# Patient Record
Sex: Male | Born: 1990 | Race: White | Hispanic: No | Marital: Single | Smoking: Never smoker
Health system: Southern US, Community
[De-identification: ages and names within clinical notes are randomized; demographics above are authoritative.]

## PROBLEM LIST (undated history)

## (undated) DIAGNOSIS — S83519A Sprain of anterior cruciate ligament of unspecified knee, initial encounter: Secondary | ICD-10-CM

## (undated) HISTORY — PX: WISDOM TOOTH EXTRACTION: SHX21

---

## 2015-08-06 ENCOUNTER — Ambulatory Visit (INDEPENDENT_AMBULATORY_CARE_PROVIDER_SITE_OTHER): Payer: Managed Care, Other (non HMO) | Admitting: Medical

## 2015-08-06 ENCOUNTER — Encounter: Payer: Self-pay | Admitting: Medical

## 2015-08-06 VITALS — BP 110/72 | HR 80 | Ht 68.75 in | Wt 198.0 lb

## 2015-08-06 DIAGNOSIS — S8392XA Sprain of unspecified site of left knee, initial encounter: Secondary | ICD-10-CM | POA: Diagnosis not present

## 2015-08-06 NOTE — Progress Notes (Signed)
Subjective: Chief Complaint  Patient presents with  . Knee Pain    started wednesday. pt was playing basketball and "twisted" his lt knee. never had problems with it before not swollen    Here as a new patient.  Was [facility] prior, but hasn't been to a doctor in years.   Father was in [employer], Orthoptisttanker pilot.   Was playing basketball Wednesday night and hurt left knee.   Was running sideways, thinks he twisted the left knee funny.  Denies hearing a pop.   Had some pain with the twisting.   Stopped playing immediately.   Tried to stretch it out a bit.    Denies swelling.   Took some pain medication, iced for 15 minutes on and off for an hour for the past 2 days.  Today doesn't hurt as bad.    Was able to ambulate yesterday but it hurt.   Denies full ROM.   No prior knee injury or surgery.   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  No other aggravating or relieving factors. No other complaint.  ROS as in subjective   Objective: BP 110/72 mmHg  Pulse 80  Ht 5' 8.75" (1.746 m)  Wt 198 lb (89.812 kg)  BMI 29.46 kg/m2  Gen: wd, wn, nad Skin: unremarkable, no erythema or bruising MSK: mild tenderness left biceps femoris tendon medially and patellar tenderness, mild pain with knee ROM, worse with resisted knee flexion, but otherwise no swelling, nontender, no laxity, no other deformity.  Rest of bilat leg exam unremarkable No edema Legs neurovascularly intact    Assessment: Encounter Diagnosis  Name Primary?  . Left knee sprain, initial encounter Yes     Plan: reassured no obvious meniscal or ACL issue, seems to be mild sprain.   Advised over the weekend he c/t ice 20 min at a time, elevation, rest, stay off the leg when possible the next several days, he will c/t OTC Ibuprofen, and I would expect pain to resolve within 1-2 weeks.  Advised gradual return to activity over the next 2 weeks as pain resolves.  If worse or not significant improvement within a week, then  recheck.

## 2015-08-24 ENCOUNTER — Encounter: Payer: Self-pay | Admitting: Medical

## 2015-08-24 ENCOUNTER — Telehealth: Payer: Self-pay

## 2015-08-24 NOTE — Telephone Encounter (Signed)
Records and Immunization Report [facility]d from [facility] on pt. Placed in your folder.

## 2015-12-17 ENCOUNTER — Encounter: Payer: Self-pay | Admitting: Medical

## 2015-12-17 ENCOUNTER — Ambulatory Visit (INDEPENDENT_AMBULATORY_CARE_PROVIDER_SITE_OTHER): Payer: Managed Care, Other (non HMO) | Admitting: Medical

## 2015-12-17 VITALS — BP 108/70 | HR 72 | Ht 68.75 in | Wt 196.0 lb

## 2015-12-17 DIAGNOSIS — M25562 Pain in left knee: Secondary | ICD-10-CM

## 2015-12-17 DIAGNOSIS — S8992XD Unspecified injury of left lower leg, subsequent encounter: Secondary | ICD-10-CM

## 2015-12-17 DIAGNOSIS — S8992XA Unspecified injury of left lower leg, initial encounter: Secondary | ICD-10-CM | POA: Insufficient documentation

## 2015-12-17 NOTE — Progress Notes (Signed)
Subjective: Chief Complaint  Patient presents with  . Follow-up    Left knee still hurts with certain movement, stairs, biking.   Here for recheck on left knee.   I saw him back in May for injury playing basketball.  He initially had popping sensations, pain with twisting knee, pain going down stairs.  Since last visit he did RICE initially, got improvement of the popping and medial posterior pain, but he has continued to have pain in anterior medial knee joint line, pain in patella, pain particular going down stairs and bending the knee.    Denies swelling, no giving way, no paresthesias.    He has been careful with exercise due to pain. elliptical doesn't cause pain, but sometimes walking causes pain, definitely gets pain with squatting, sometimes with twisting, and going down stairs causes pain.  Currently not taking medication or ice or bracing for the symptoms.   Back in May his initial history was that while playing basketball he hurt left knee.  Was running sideways, thinks he twisted the left knee funny.  Denies hearing a pop.   Had some pain with the twisting.   Stopped playing immediately.   Tried to stretch it out a bit.    Denies swelling.   Took some pain medication, iced for 15 minutes on and off for an hour for the past 2 days.   Denies full ROM.   No prior knee injury or surgery.   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  No other aggravating or relieving factors. No other complaint.  ROS as in subjective   Objective: BP 108/70 (BP Location: Left Arm, Patient Position: Sitting, Cuff Size: Normal)   Pulse 72   Ht 5' 8.75" (1.746 m)   Wt 196 lb (88.9 kg)   SpO2 98%   BMI 29.16 kg/m   Gen: wd, wn, nad Skin: unremarkable, no erythema or bruising MSK: not tender to palpation.   Pain noted with knee flexion past 80 degrees, pain reported in superior portion of patella with flexion, pain noted in patellar tendon with flexion but not with palpation.   Although he originally reported  pain in medial joint line he is nontender to palpation nor does he seem to have pain with motion with medial joint line.  No joint swelling, no joint laxity. . No pop or grind with meniscal test, but he does have some pain with meniscus test.    Rest of bilat leg exam unremarkable No edema Legs neurovascularly intact    Assessment: Encounter Diagnoses  Name Primary?  . Left knee pain Yes  . Left knee injury, subsequent encounter      Plan: Discussed possible causes of his symptoms.   With repeated exam, he seems to note pain in different spots at different times on exam.   Not completley clear etiology.   I suspect patellofemoral issue based on exam, but can't rule out meniscal injury.  He hasn't had swelling even from original injury in May.   Will refer to ortho for further eval.    Jomarie LongsJoseph was seen today for follow-up.  Diagnoses and all orders for this visit:  Left knee pain -     Ambulatory referral to Orthopedic Surgery  Left knee injury, subsequent encounter -     Ambulatory referral to Orthopedic Surgery

## 2015-12-20 ENCOUNTER — Telehealth: Payer: Self-pay

## 2015-12-20 NOTE — Telephone Encounter (Signed)
Spoke to patient. [facility]

## 2015-12-21 ENCOUNTER — Other Ambulatory Visit: Payer: Self-pay | Admitting: Sports Medicine

## 2015-12-21 DIAGNOSIS — M25562 Pain in left knee: Secondary | ICD-10-CM

## 2015-12-24 ENCOUNTER — Ambulatory Visit
Admission: RE | Admit: 2015-12-24 | Discharge: 2015-12-24 | Disposition: A | Discharge status: Home / Self Care | Payer: Managed Care, Other (non HMO) | Source: Ambulatory Visit | Attending: Sports Medicine | Admitting: Sports Medicine

## 2015-12-24 DIAGNOSIS — M25562 Pain in left knee: Secondary | ICD-10-CM

## 2015-12-28 ENCOUNTER — Ambulatory Visit (INDEPENDENT_AMBULATORY_CARE_PROVIDER_SITE_OTHER): Payer: Managed Care, Other (non HMO) | Admitting: Sports Medicine

## 2015-12-28 DIAGNOSIS — S83512D Sprain of anterior cruciate ligament of left knee, subsequent encounter: Secondary | ICD-10-CM | POA: Diagnosis not present

## 2015-12-28 DIAGNOSIS — M25562 Pain in left knee: Secondary | ICD-10-CM | POA: Diagnosis not present

## 2015-12-30 ENCOUNTER — Other Ambulatory Visit: Payer: Self-pay

## 2016-01-06 ENCOUNTER — Encounter: Payer: Self-pay | Admitting: Physical Therapy

## 2016-01-06 ENCOUNTER — Ambulatory Visit: Payer: Managed Care, Other (non HMO) | Attending: Sports Medicine | Admitting: Physical Therapy

## 2016-01-06 DIAGNOSIS — M25562 Pain in left knee: Secondary | ICD-10-CM | POA: Diagnosis present

## 2016-01-06 DIAGNOSIS — R262 Difficulty in walking, not elsewhere classified: Secondary | ICD-10-CM | POA: Diagnosis present

## 2016-01-06 DIAGNOSIS — M6281 Muscle weakness (generalized): Secondary | ICD-10-CM | POA: Diagnosis present

## 2016-01-06 NOTE — Addendum Note (Signed)
Addended by: Narda AmberMARTIN, JENNIFER R on: 01/06/2016 01:52 PM   Modules accepted: Orders

## 2016-01-06 NOTE — Patient Instructions (Signed)
          Perform all exercises with pain free motion. (if it hurts stop).  20 reps each exercise 2-3 sets 2 times daily  RIE (rest, ice, elevate) if inflammation/swelling or pain

## 2016-01-06 NOTE — Therapy (Signed)
[facility] [address], [address] [place], [place], [address] Phone: [phone]   Fax:  [phone]  Physical Therapy Evaluation  Patient Details  Name: Tyler QuakerJoseph Delorey MRN: [id number] Date of Birth: 02/09/1991 Referring Provider: Gaspar BiddingMichael Rigby, MD  Encounter Date: 01/06/2016      PT End of Session - 01/06/16 1248    Visit Number 1   Number of Visits 9   Authorization Type [employer]   PT Start Time 1235   PT Stop Time 1315   PT Time Calculation (min) 40 min   Activity Tolerance Patient tolerated treatment well   Behavior During Therapy [facility] for tasks assessed/performed      History reviewed. No pertinent past medical history.  History reviewed. No pertinent surgical history.  There were no vitals filed for this visit.       Subjective Assessment - 01/06/16 1238    Subjective Pt reporting after playing basketball on 08/04/15 pt suffered Left knee twisting injury. Pt reports he was referred to Gaspar BiddingMichael Rigby, MD. MRI revealing ACL tear.    How long can you sit comfortably? unlimited   How long can you stand comfortably? unlimited   How long can you walk comfortably? depends on surface, difficulty down hill    Diagnostic tests MRI [facility]    Patient Stated Goals Knee stability   Currently in Pain? Yes   Pain Score 0-No pain   Pain Location Knee   Pain Orientation Left   Pain Descriptors / Indicators Aching   Pain Type Chronic pain   Pain Onset More than a month ago   Pain Frequency Intermittent   Aggravating Factors  down hill walking, stairs, bending the knee, running, side motions, pivots, playing sports   Pain Relieving Factors resting,    Effect of Pain on Daily Activities unable to play sports, walking down stairs,    Multiple Pain Sites No            [facility] PT Assessment - 01/06/16 0001      Assessment   Medical Diagnosis left ACL sprain   Referring Provider Gaspar BiddingMichael Rigby, MD   Onset  Date/Surgical Date 08/04/15   Hand Dominance Right   Prior Therapy none     Precautions   Precautions None     Restrictions   Weight Bearing Restrictions No     Balance Screen   Has the patient fallen in the past 6 months No     Home Environment   Living Environment Private residence   Available Help at Discharge Family;Friend(s)     Prior Function   Level of Independence Independent   Vocation Full time employment   [employer] Works at [employer] for [employer]   Leisure play basketball, soccer, run, workout     Cognition   Overall Cognitive Status Within Functional Limits for tasks assessed     ROM / Strength   AROM / PROM / Strength AROM;Strength     AROM   AROM Assessment Site Knee   Right/Left Knee Left   Left Knee Extension 0   Left Knee Flexion 128     Strength   Strength Assessment Site Knee   Right/Left Knee Right;Left   Right Knee Flexion 5/5   Right Knee Extension 5/5   Left Knee Flexion 4/5   Left Knee Extension 4/5     Transfers   Five time sit to stand comments  15 seconds     Ambulation/Gait   Ambulation/Gait Yes  Ambulation/Gait Assistance 7: Independent   Ambulation Distance (Feet) 50 Feet   Assistive device None   Gait Pattern Step-through pattern  wide BOS   Ambulation Surface Level;Indoor                           PT Education - 01/06/16 1321    Education provided Yes   Education Details HEP   Person(s) Educated Patient   Methods Explanation;Demonstration;Handout   Comprehension Verbalized understanding;Returned demonstration          PT Short Term Goals - 01/06/16 1331      PT SHORT TERM GOAL #1   Title pt will be indpendent in his HEP and progression   Baseline issued 01/06/16   Time 2   Period Weeks   Status New           PT Long Term Goals - 01/06/16 1332      PT LONG TERM GOAL #1   Title Pt will be independent with a HEP and safe gym exercises and use of equipment.    Time 6   Period Weeks   Status New     PT LONG TERM GOAL #2   Title Pt will be able to descend the stairs with no pain using no hand rails with step over step pattern.   Time 6   Period Weeks   Status New     PT LONG TERM GOAL #3   Title Pt will improve his FOTO from 36% limiation to </= 26% limitation.    Baseline 36% limited   Time 6   Period Weeks   Status New     PT LONG TERM GOAL #4   Title Pt will be able to begin jogging on level surfaces to prepare for back to sports.    Baseline pt unable to currently due to increased pain   Time 6   Period Weeks   Status New               Plan - 01/06/16 1322    Clinical Impression Statement Pt presents with sprain in left ACL. Pt is very active and was injured while playing basketball. Pt has continued to work at the gym with pain free exercises.    Rehab Potential Excellent   PT Frequency 1x / week   PT Duration 6 weeks   PT Treatment/Interventions Functional mobility training;Stair training;Therapeutic activities;Therapeutic exercise;Balance training;Patient/family education;Passive range of motion;Manual techniques;Taping;Cryotherapy;Vasopneumatic Device   PT Next Visit Plan instruct pt on proper use of gym equiptment to prevent further injury to ACL, bike, side stepping squats and lunges, resistive exercises as pt tolerates   PT Home Exercise Plan SLR, Hip ABD in sidelying, hip ADD in sidelying, prone hip extension, wall squats   Consulted and Agree with Plan of Care Patient      Patient will benefit from skilled therapeutic intervention in order to improve the following deficits and impairments:  Difficulty walking, Pain, Decreased activity tolerance, Impaired perceived functional ability, Decreased strength  Visit Diagnosis: Acute pain of left knee  Difficulty in walking, not elsewhere classified  Muscle weakness (generalized)     Problem List Patient Active Problem List   Diagnosis Date Noted  . Left knee  pain 12/17/2015  . Left knee injury 12/17/2015    Sharmon Leyden, MPT  01/06/2016, 1:47 PM  [facility] [address], [address] [place], [place], [address] Phone: [phone]  Fax:  [phone]  Name: Trafton Roker MRN: [id number] Date of Birth: 03-06-1991

## 2016-01-11 ENCOUNTER — Ambulatory Visit: Payer: Managed Care, Other (non HMO)

## 2016-01-11 DIAGNOSIS — M6281 Muscle weakness (generalized): Secondary | ICD-10-CM

## 2016-01-11 DIAGNOSIS — R262 Difficulty in walking, not elsewhere classified: Secondary | ICD-10-CM

## 2016-01-11 DIAGNOSIS — M25562 Pain in left knee: Secondary | ICD-10-CM | POA: Diagnosis not present

## 2016-01-11 NOTE — Patient Instructions (Addendum)
Forward Lunge    Standing with feet shoulder width apart and stomach tight, step forward with left leg. Repeat __2x10__ times per set. Do __1-2__ sets per session. Do 1-2____ sessions per day.   Anterior Step-Down    Stand with both feet on _6__ inch step. Step down in A direction with left foot, touching heel to the floor and return _2x10__ times. _2__ sets _1-2__ times per day.   HIP: Hamstrings - Short Sitting    Rest leg on raised surface. Keep knee straight. Lift chest. Hold __20_ seconds. __3_ reps per set, _3__ sets per day, ___ days per week  Copyright  VHI. All rights reserved.    [facility] [address], [address] [place], [place] [address] Phone # [phone] Fax [phone]

## 2016-01-11 NOTE — Therapy (Signed)
[facility] [address] [place], [place], [address] Phone: [phone]   Fax:  [phone]  Physical Therapy Treatment  Patient Details  Name: Tyler Lozano MRN: [id number] Date of Birth: [birth year] Referring Provider: Gaspar Bidding, MD  Encounter Date: 01/11/2016      PT End of Session - 01/11/16 0757    Visit Number 2   Authorization Type [employer]   PT Start Time 0727   PT Stop Time 0758   PT Time Calculation (min) 31 min   Activity Tolerance Patient tolerated treatment well   Behavior During Therapy [facility] for tasks assessed/performed      History reviewed. No pertinent past medical history.  History reviewed. No pertinent surgical history.  There were no vitals filed for this visit.      Subjective Assessment - 01/11/16 0726    Subjective Pt reports that he has been doing the exercises.  Not able to run.     Currently in Pain? No/denies                         [facility] Adult PT Treatment/Exercise - 01/11/16 0001      Exercises   Exercises Knee/Hip     Knee/Hip Exercises: Stretches   Active Hamstring Stretch Left;3 reps;20 seconds     Knee/Hip Exercises: Machines for Strengthening   Cybex Leg Press Bil legs: 90# x 10, 110# 2x10, Lt only 50# 2x10     Knee/Hip Exercises: Standing   Forward Lunges Left;2 sets;10 reps   Step Down 2 sets;10 reps;Left;Hand Hold: 1;Step Height: 6"   SLS on mini tramp 3x 20 seconds  verbal cues for quad activation   Walking with Sports Cord 35# forward and revers x10, sidestepping x 5 each                PT Education - 01/11/16 0752    Education provided Yes   Education Details hamstring stretch, step down, lunge   Person(s) Educated Patient   Methods Explanation;Demonstration;Handout   Comprehension Verbalized understanding;Returned demonstration          PT Short Term Goals - 01/11/16 0727      PT SHORT TERM GOAL #1   Title pt will be  indpendent in his HEP and progression   Time 2   Period Weeks   Status On-going           PT Long Term Goals - 01/11/16 6962      PT LONG TERM GOAL #1   Title Pt will be independent with a HEP and safe gym exercises and use of equipment.   Time 6   Period Weeks   Status On-going               Plan - 01/11/16 0727    Clinical Impression Statement Pt with only 1 session after evaluation.  Pt is independent in HEP for Lt knee strength.  Pt was able to complete all exercises in the clinic for strength without limitation.  Pt will try to perform leg press at the gym with bil. legs and try to use Lt LE more with this.  Pt will continue to benefit from skilled PT for Lt LE strength.    Rehab Potential Excellent   PT Frequency 1x / week   PT Duration 6 weeks   PT Treatment/Interventions Functional mobility training;Stair training;Therapeutic activities;Therapeutic exercise;Balance training;Patient/family education;Passive range of motion;Manual techniques;Taping;Cryotherapy;Vasopneumatic Device   PT Next Visit Plan  instruct pt on proper use of gym equiptment to prevent further injury to ACL, bike, side stepping squats and lunges, resistive exercises as pt tolerates   Consulted and Agree with Plan of Care Patient      Patient will benefit from skilled therapeutic intervention in order to improve the following deficits and impairments:  Difficulty walking, Pain, Decreased activity tolerance, Impaired perceived functional ability, Decreased strength  Visit Diagnosis: Acute pain of left knee  Difficulty in walking, not elsewhere classified  Muscle weakness (generalized)     Problem List Patient Active Problem List   Diagnosis Date Noted  . Left knee pain 12/17/2015  . Left knee injury 12/17/2015    Lorrene ReidKelly Sheri Prows, PT 01/11/16 7:59 AM  [facility] [address], [address] [place], [place], [address] Phone: [phone]    Fax:  [phone]  Name: [place] Lozano MRN: [id number] Date of Birth: 07/31/1990

## 2016-01-18 ENCOUNTER — Ambulatory Visit: Payer: Managed Care, Other (non HMO)

## 2016-01-18 DIAGNOSIS — M6281 Muscle weakness (generalized): Secondary | ICD-10-CM

## 2016-01-18 DIAGNOSIS — R262 Difficulty in walking, not elsewhere classified: Secondary | ICD-10-CM

## 2016-01-18 DIAGNOSIS — M25562 Pain in left knee: Secondary | ICD-10-CM | POA: Diagnosis not present

## 2016-01-18 NOTE — Therapy (Addendum)
[facility] [address], [place], [place], [address] Phone: [phone]   Fax:  [phone]  Physical Therapy Treatment  Patient Details  Name: Tyler Lozano MRN: [id number] Date of Birth: [birth year] Referring Provider: Teresa Coombs, MD  Encounter Date: 01/18/2016      PT End of Session - 01/18/16 0800    Visit Number 3   PT Start Time 0729   PT Stop Time 0800   PT Time Calculation (min) 31 min   Activity Tolerance Patient tolerated treatment well   Behavior During Therapy [facility] for tasks assessed/performed      History reviewed. No pertinent past medical history.  History reviewed. No pertinent surgical history.  There were no vitals filed for this visit.      Subjective Assessment - 01/18/16 0731    Subjective Lt knee doing well.  A few episodes of Lt knee giving out on a wet surface.     Currently in Pain? No/denies                         [facility] Adult PT Treatment/Exercise - 01/18/16 0001      Knee/Hip Exercises: Stretches   Active Hamstring Stretch Left;3 reps;20 seconds     Knee/Hip Exercises: Machines for Strengthening   Cybex Leg Press 110# 1x10, 120# 2x10Lt only 60# 2x10     Knee/Hip Exercises: Standing   Step Down 2 sets;10 reps;Left;Hand Hold: 1;Step Height: 6"   SLS on mini tramp 3x 20 seconds  verbal cues for quad activation   Rebounder single leg stance on Lt on level surface: 3x10 tosses with red ball   Walking with Sports Cord 35# forward and revers x10, sidestepping x 5 each                  PT Short Term Goals - 01/18/16 0729      PT SHORT TERM GOAL #1   Title pt will be indpendent in his HEP and progression   Status Achieved           PT Long Term Goals - 01/18/16 0729      PT LONG TERM GOAL #1   Title Pt will be independent with a HEP and safe gym exercises and use of equipment.   Time 6   Period Weeks   Status On-going     PT LONG TERM  GOAL #2   Title Pt will be able to descend the stairs with no pain using no hand rails with step over step pattern.   Time 6   Period Weeks   Status On-going     PT LONG TERM GOAL #3   Title Pt will improve his FOTO from 36% limiation to </= 26% limitation.    Period Weeks   Status On-going     PT LONG TERM GOAL #4   Title Pt will be able to begin jogging on level surfaces to prepare for back to sports.    Time 6   Period Weeks   Status On-going               Plan - 01/18/16 0731    Clinical Impression Statement Pt continues to work to strengthen the Lt knee at the gym.  Pt reports that he is taking it easy with descending steps due to feeling unstable in the Lt knee. Pt demonstrates instability of the Lt knee with step-down exercise today.   Pt has not  been running.  Able to perform all exericse in clinic without difficulty.  Pt will put PT on hold until he sees MD.     Rehab Potential Excellent   PT Frequency 1x / week   PT Duration 6 weeks   PT Treatment/Interventions Functional mobility training;Stair training;Therapeutic activities;Therapeutic exercise;Balance training;Patient/family education;Passive range of motion;Manual techniques;Taping;Cryotherapy;Vasopneumatic Device   PT Next Visit Plan instruct pt on proper use of gym equiptment to prevent further injury to ACL, bike, side stepping squats and lunges, resistive exercises as pt tolerates.  Pt will see MD 01/25/16 and discuss future PT.     Consulted and Agree with Plan of Care Patient      Patient will benefit from skilled therapeutic intervention in order to improve the following deficits and impairments:  Difficulty walking, Pain, Decreased activity tolerance, Impaired perceived functional ability, Decreased strength  Visit Diagnosis: Acute pain of left knee  Difficulty in walking, not elsewhere classified  Muscle weakness (generalized)     Problem List Patient Active Problem List   Diagnosis Date  Noted  . Left knee pain 12/17/2015  . Left knee injury 12/17/2015     Sigurd Sos, PT 01/18/16 8:02 AM PHYSICAL THERAPY DISCHARGE SUMMARY  Visits from Start of Care: 3  Current functional level related to goals / functional outcomes: Pt didn't return after session on 01/18/16.  He was going to see MD to determine future PT and didn't return.     Remaining deficits: See above for most current status.     Education / Equipment: HEP,gym exercises Plan: Patient agrees to discharge.  Patient goals were partially met. Patient is being discharged due to not returning since the last visit.  ?????        Sigurd Sos, PT 02/24/16 8:50 AM  [facility] [address], [place], [place], [address] Phone: [phone]   Fax:  [phone]  Name: Ardon Franklin MRN: [id number] Date of Birth: 07-19-1990

## 2016-01-25 ENCOUNTER — Ambulatory Visit (INDEPENDENT_AMBULATORY_CARE_PROVIDER_SITE_OTHER): Payer: Managed Care, Other (non HMO) | Admitting: Sports Medicine

## 2016-01-25 ENCOUNTER — Encounter (INDEPENDENT_AMBULATORY_CARE_PROVIDER_SITE_OTHER): Payer: Self-pay | Admitting: Sports Medicine

## 2016-01-25 VITALS — BP 117/67 | HR 72 | Ht 68.75 in | Wt 195.0 lb

## 2016-01-25 DIAGNOSIS — M25562 Pain in left knee: Secondary | ICD-10-CM

## 2016-01-25 DIAGNOSIS — S83512D Sprain of anterior cruciate ligament of left knee, subsequent encounter: Secondary | ICD-10-CM

## 2016-01-25 DIAGNOSIS — S83512A Sprain of anterior cruciate ligament of left knee, initial encounter: Secondary | ICD-10-CM | POA: Insufficient documentation

## 2016-01-25 NOTE — Progress Notes (Signed)
Tyler QuakerJoseph Fertig - 25 y.o. male MRN [id number]  Date of birth: 01/11/1991  Office Visit Note: Visit Date: 01/25/2016 PCP: Ernst BreachYSINGER, DAVID SHANE, PA-C Referred by: Jac Canavanysinger, David S, PA-C  Subjective: Chief Complaint  Patient presents with  . Left Knee - Follow-up  . ROV   HPI: Patient states left knee is doing better.  Currently waiting to decide about [employer] brace, would like to discuss more.    He has been working with physical therapy as well as working diligently in [employer] on his own. Symptoms are significantly improved & symptoms of giving way have greatly diminished although still having some difficulty going down steps. He has had [employer] reach out to him however the cost is prohibitive. He does have an upcoming ski trip however is unsure as to whether or not he will actually ski. Otherwise his goals are to returned activities including basketball.    ROS Otherwise per HPI.  Assessment & Plan: Visit Diagnoses:  1. Acute pain of left knee   2. Sprain of anterior cruciate ligament of left knee, subsequent encounter     Plan: Findings:  The symptoms are significantly better than when he initially presented. I do think continuing with physical therapy & been diligent with home exercise program will likely provide him with the symptom control he desires. I would like to evaluate him further at follow-up with dynamic 1 leg agility testing prior to agreeing to ski trip. Okay to hold off on obtaining the brace at this time & we discussed that a hinged knee brace would likely not provide him any significant adequate protection compared to appropriate rehabilitation. We'll plan to see him back in 6 weeks for once again dynamic testing of his knee.    Meds & Orders: No orders of the defined types were placed in this encounter.  No orders of the defined types were placed in this encounter.   Follow-up: Return in about 6 weeks (around 03/07/2016) for repeat clinical exam.   Procedures: No  procedures performed  No notes on file   Clinical History: No specialty comments available.  He reports that he has never smoked. He does not have any smokeless tobacco history on file. No results for input(s): HGBA1C, LABURIC in the last 8760 hours.  Objective:  VS:  HT:5' 8.75" (174.6 cm)   WT:195 lb (88.5 kg)  BMI:29.1    BP:117/67  HR:72bpm  TEMP: ( )  RESP:  Physical Exam  Constitutional: No distress.  HENT:  Head: Normocephalic and atraumatic.  Eyes: Right eye exhibits no discharge. Left eye exhibits no discharge. No scleral icterus.  Pulmonary/Chest: Effort normal. No respiratory distress.  Neurological: He is alert.  Appropriately interactive.  Skin: Skin is warm and dry. No rash noted. He is not diaphoretic. No erythema. No pallor.  Psychiatric: Judgment normal.    Left Knee Exam   Comments:  Knee is overall well line in quite muscular. He has 4-5 mm anterior translation with anterior drawer testing but it otherwise constrained knee. His knee is stable to varus & valgus strain as well as pain-free with McMurray's & Thessaly testing. VMO definition is markedly improved.     Imaging: No results found.  Past Medical/Family/Surgical/Social History: Medications & Allergies reviewed per EMR Patient Active Problem List   Diagnosis Date Noted  . Sprain of anterior cruciate ligament of left knee 01/25/2016  . Left knee pain 12/17/2015  . Left knee injury 12/17/2015   No past medical history on file. No  family history on file. No past surgical history on file. Social History   Occupational History  . Not on file.   Social History Main Topics  . Smoking status: Never Smoker  . Smokeless tobacco: Not on file  . Alcohol use Not on file  . Drug use: Unknown  . Sexual activity: Not on file

## 2016-03-07 ENCOUNTER — Encounter (INDEPENDENT_AMBULATORY_CARE_PROVIDER_SITE_OTHER): Payer: Self-pay | Admitting: Sports Medicine

## 2016-03-07 ENCOUNTER — Ambulatory Visit (INDEPENDENT_AMBULATORY_CARE_PROVIDER_SITE_OTHER): Payer: Managed Care, Other (non HMO) | Admitting: Sports Medicine

## 2016-03-07 VITALS — BP 113/60 | HR 86 | Ht 68.75 in | Wt 195.0 lb

## 2016-03-07 DIAGNOSIS — S83512D Sprain of anterior cruciate ligament of left knee, subsequent encounter: Secondary | ICD-10-CM | POA: Diagnosis not present

## 2016-03-07 DIAGNOSIS — M25562 Pain in left knee: Secondary | ICD-10-CM | POA: Diagnosis not present

## 2016-03-07 NOTE — Progress Notes (Signed)
   Tyler Lozano - 25 y.o. male MRN [id number]  Date of birth: 07/08/1990  Office Visit Note: Visit Date: 03/07/2016 PCP: Ernst BreachYSINGER, DAVID SHANE, PA-C Referred by: Jac Canavanysinger, David S, PA-C  Subjective: Chief Complaint  Patient presents with  . Left Knee - Follow-up  . Follow-up    Patient states left knee doing fine. Would like to talk about surgery?   HPI: Patient reports continuing to have improvement however feeling as though something is off especially with side-to-side motions. He was able to run 4 miles yesterday & did fine from a pain standpoint but had discomfort especially while stretching & with the above motion. He is not requiring any medication. No swelling appreciated. He would like to discuss anterior cruciate ligament reconstruction given the continued symptoms. ROS: Otherwise per HPI.   Clinical History: No specialty comments available.  He reports that he has never smoked. He does not have any smokeless tobacco history on file.  No results for input(s): HGBA1C, LABURIC in the last 8760 hours.  Assessment & Plan: Visit Diagnoses:    ICD-9-CM ICD-10-CM   1. Acute pain of left knee 719.46 M25.562   2. Sprain of anterior cruciate ligament of left knee, subsequent encounter V58.89 S83.512D    844.2      Plan: Given the persistent symptoms of instability & feeling as though something were "not right" anterior cruciate ligament reconstruction is likely indicated given the prior findings on MRI. Proximal tear of the anterior cruciate ligament with a constrained knee is the most likely cause of his symptoms & the patient understands the recovery aspects from anterior cruciate ligament reconstruction in light discuss this option with Dr. August Saucerean. He should continue with therapeutic exercises & rehabbing the knee as this will only speed up his recovery after anterior cruciate ligament reconstruction.   Follow-up: Return for with Dr. August Saucerean ASAP for Surgical consult.  Meds: No orders of  the defined types were placed in this encounter.  Procedures: No notes on file   Objective:  VS:  HT:5' 8.75" (174.6 cm)   WT:195 lb (88.5 kg)  BMI:29.1    BP:113/60  HR:86bpm  TEMP: ( )  RESP:  Physical Exam:  Adult male. Alert and appropriate.  In no acute distress.  Lower extremities are overall well aligned with no significant deformity. No significant swelling.  Distal pulses 2+/4. No significant bruising/ecchymosis or erythema the skin Left knee: Well aligned. VMO definition is less on the left than on the right. He has a ligamentously constrained knee however has increased laxity with anterior drawer & Lachman's on the left compared to the right. There is a valgus strain are normal. Some discomfort with McMurray's & reproducible symptoms of instability & uneasiness with McMurray's but no appreciable click. Imaging: No results found.  Past Medical/Family/Surgical/Social History: Medications & Allergies reviewed per EMR Patient Active Problem List   Diagnosis Date Noted  . Sprain of anterior cruciate ligament of left knee 01/25/2016  . Left knee pain 12/17/2015  . Left knee injury 12/17/2015   No past medical history on file. No family history on file. No past surgical history on file. Social History   Occupational History  . Not on file.   Social History Main Topics  . Smoking status: Never Smoker  . Smokeless tobacco: Not on file  . Alcohol use Not on file  . Drug use: Unknown  . Sexual activity: Not on file

## 2016-03-07 NOTE — Patient Instructions (Signed)
I am transferring practices as of January 1st  to [facility] at [place].  This is a great opportunity & I am saddened to be leaving [facility] however & excited for new opportunities. I will continue to be seeing patients at [facility] through the end of December. I am happy to see you at the new location but also am confident that you are in great hands with the excellent providers here at [facility].  We are not currently scheduling patients at the new location at this time but if you look on [facility]'s website a contact information should be available there closer to January. Additionally [email or web address] will have information when it becomes available.    The telephone number will be [phone]  - Nobody will be answering this phone number until closer to January. 

## 2016-03-13 ENCOUNTER — Ambulatory Visit (INDEPENDENT_AMBULATORY_CARE_PROVIDER_SITE_OTHER): Payer: Managed Care, Other (non HMO) | Admitting: Orthopedic Surgery

## 2016-03-13 ENCOUNTER — Encounter (INDEPENDENT_AMBULATORY_CARE_PROVIDER_SITE_OTHER): Payer: Self-pay | Admitting: Orthopedic Surgery

## 2016-03-13 DIAGNOSIS — S83512D Sprain of anterior cruciate ligament of left knee, subsequent encounter: Secondary | ICD-10-CM

## 2016-03-13 NOTE — Progress Notes (Signed)
Office Visit Note   Patient: Tyler Lozano           Date of Birth: 05/21/1990           MRN: [id number] Visit Date: 03/13/2016 Requested by: Jac Canavanavid S Tysinger, PA-C [address] [place], [place] [address] PCP: Ernst BreachYSINGER, DAVID SHANE, PA-C  Subjective: Chief Complaint  Patient presents with  . Left Knee - Pain    HPIJoseph is a 25 year old patient who is active who injured his left knee playing basketball 3 or 4 months ago.  MRI scanning showed anterior cruciate ligament tear.  He has tried rehabilitation and strengthening but reports continued symptomatic instability.  He wants to return to playing basketball and soccer.  There is no family history of DVT or pulmonary embolism.  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  Patient denies  fevers or chills.    Assessment & Plan: Visit Diagnoses:  1. 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  Plan is anterior cruciate ligament reconstruction using hamstring autograft.  Risks and benefits discussed with the patient including not limited to infection or vessel damage knee stiffness.  Time out of work also discussed with the patient as well as rehabilitative goals.  All questions answered  Follow-Up Instructions: No Follow-up on file.   Orders:  No orders of the defined types were placed in this encounter.  No orders of the defined types were placed in this encounter.     Procedures: No procedures performed   Clinical Data: No additional  findings.  Objective: Vital Signs: There were no vitals taken for this visit.  Physical Exam   Constitutional: Patient appears well-developed HEENT:  Head: Normocephalic Eyes:EOM are normal Neck: Normal range of motion Cardiovascular: Normal rate Pulmonary/chest: Effort normal Neurologic: Patient is alert Skin: Skin is warm Psychiatric: Patient has normal mood and affect    Ortho ExamExamination of the left knee demonstrates full motion from extension to full flexion.  There is no posterior lateral rotatory instability.  Extensor mechanism is intact.  There is no effusion.  There is no groin pain with internal/external rotation on the left-hand side.  Pedal pulses palpable.  Collaterals are stable to varus and valgus stress at 0 and 30.  PCL is intact anterior cruciate ligament is out there is no other masses lymph adenopathy or skin changes noted in the left knee region  Specialty Comments:  No specialty comments available.  Imaging: No results found.   PMFS History: Patient Active Problem List   Diagnosis Date Noted  . Sprain of anterior cruciate ligament of left knee 01/25/2016  . Left knee pain 12/17/2015  . Left knee injury 12/17/2015   No past medical history on file.  No family history on file.  No past surgical history on file. Social History   Occupational History  . Not on file.   Social History Main Topics  . Smoking status: Never Smoker  . Smokeless tobacco: Not on  file  . Alcohol use Not on file  . Drug use: Unknown  . Sexual activity: Not on file

## 2016-03-31 ENCOUNTER — Other Ambulatory Visit (INDEPENDENT_AMBULATORY_CARE_PROVIDER_SITE_OTHER): Payer: Self-pay | Admitting: Orthopedic Surgery

## 2016-03-31 DIAGNOSIS — S83512A Sprain of anterior cruciate ligament of left knee, initial encounter: Secondary | ICD-10-CM

## 2016-04-19 ENCOUNTER — Inpatient Hospital Stay (INDEPENDENT_AMBULATORY_CARE_PROVIDER_SITE_OTHER): Payer: Managed Care, Other (non HMO) | Admitting: Orthopedic Surgery

## 2016-05-08 NOTE — Pre-Procedure Instructions (Signed)
Wyn QuakerJoseph Prater  05/08/2016      [facility] [place] - [address] [place] [address] Phone: [phone] Fax: [phone]    Your procedure is scheduled on FEBRUARY 20  [facility] at 0900 A.M.  Call this number if you have problems the morning of surgery:  [phone]   Remember:  Do not eat food or drink liquids after midnight.   Take these medicines the morning of surgery with A SIP OF WATER NONE  7 days prior to surgery STOP taking any Aspirin, Aleve, Naproxen, Ibuprofen, Motrin, Advil, Goody's, BC's, all herbal medications, fish oil, and all vitamins    Do not wear jewelry  Do not wear lotions, powders, orcologne, or deoderant.  Men may shave face and neck.  Do not bring valuables to the hospital.  [facility] is not responsible for any belongings or valuables.  Contacts, dentures or bridgework may not be worn into surgery.  Leave your suitcase in the car.  After surgery it may be brought to your room.  For patients admitted to the hospital, discharge time will be determined by your treatment team.  Patients discharged the day of surgery will not be allowed to drive home.    Special instructions:   [facility]- Preparing For Surgery  Before surgery, you can play an important role. Because skin is not sterile, your skin needs to be as free of germs as possible. You can reduce the number of germs on your skin by washing with CHG (chlorahexidine gluconate) Soap before surgery.  CHG is an antiseptic cleaner which kills germs and bonds with the skin to continue killing germs even after washing.  Please do not use if you have an allergy to CHG or antibacterial soaps. If your skin becomes reddened/irritated stop using the CHG.  Do not shave (including legs and underarms) for at least 48 hours prior to first CHG shower. It is OK to shave your face.  Please follow these instructions  carefully.   1. Shower the NIGHT BEFORE SURGERY and the MORNING OF SURGERY with CHG.   2. If you chose to wash your hair, wash your hair first as usual with your normal shampoo.  3. After you shampoo, rinse your hair and body thoroughly to remove the shampoo.  4. Use CHG as you would any other liquid soap. You can apply CHG directly to the skin and wash gently with a scrungie or a clean washcloth.   5. Apply the CHG Soap to your body ONLY FROM THE NECK DOWN.  Do not use on open wounds or open sores. Avoid contact with your eyes, ears, mouth and genitals (private parts). Wash genitals (private parts) with your normal soap.  6. Wash thoroughly, paying special attention to the area where your surgery will be performed.  7. Thoroughly rinse your body with warm water from the neck down.  8. DO NOT shower/wash with your normal soap after using and rinsing off the CHG Soap.  9. Pat yourself dry with a CLEAN TOWEL.   10. Wear CLEAN PAJAMAS   11. Place CLEAN SHEETS on your bed the night of your first shower and DO NOT SLEEP WITH PETS.    Day of Surgery: Do not apply any deodorants/lotions. Please wear clean clothes to the hospital/surgery center.      Please read over the following fact sheets that you were given.

## 2016-05-09 ENCOUNTER — Encounter (HOSPITAL_COMMUNITY)
Admission: RE | Admit: 2016-05-09 | Discharge: 2016-05-09 | Disposition: A | Discharge status: Home / Self Care | Payer: Managed Care, Other (non HMO) | Source: Ambulatory Visit | Attending: Orthopedic Surgery | Admitting: Orthopedic Surgery

## 2016-05-09 ENCOUNTER — Encounter (HOSPITAL_COMMUNITY): Payer: Self-pay

## 2016-05-09 DIAGNOSIS — Z01818 Encounter for other preprocedural examination: Secondary | ICD-10-CM | POA: Insufficient documentation

## 2016-05-09 HISTORY — DX: Sprain of anterior cruciate ligament of unspecified knee, initial encounter: S83.519A

## 2016-05-09 LAB — CBC
HEMATOCRIT: 44.4 % (ref 39.0–52.0)
HEMOGLOBIN: 15.4 g/dL (ref 13.0–17.0)
MCH: 30.5 pg (ref 26.0–34.0)
MCHC: 34.7 g/dL (ref 30.0–36.0)
MCV: 87.9 fL (ref 78.0–100.0)
Platelets: 273 10*3/uL (ref 150–400)
RBC: 5.05 MIL/uL (ref 4.22–5.81)
RDW: 12.4 % (ref 11.5–15.5)
WBC: 6.8 10*3/uL (ref 4.0–10.5)

## 2016-05-09 LAB — BASIC METABOLIC PANEL
Anion gap: 10 (ref 5–15)
BUN: 11 mg/dL (ref 6–20)
CHLORIDE: 105 mmol/L (ref 101–111)
CO2: 24 mmol/L (ref 22–32)
CREATININE: 0.87 mg/dL (ref 0.61–1.24)
Calcium: 10.1 mg/dL (ref 8.9–10.3)
GFR calc Af Amer: >60 mL/min (ref >60)
GFR calc non Af Amer: >60 mL/min (ref >60)
GLUCOSE: 95 mg/dL (ref 65–99)
POTASSIUM: 4.1 mmol/L (ref 3.5–5.1)
SODIUM: 139 mmol/L (ref 135–145)

## 2016-05-13 NOTE — H&P (Signed)
Tyler Lozano is an 26 y.o. male.   Chief Complaint: Left knee pain and instability HPI: Tyler Lozano is a 26 year old patient with left knee pain and instability.  He injured it last year playing basketball.  He's had symptomatic instability since that time despite rehabilitation.  He desires to compete in cutting and pivoting sports.  There is no family history of DVT or pulmonary embolism.  He has 6 stairs at home and will have his father come stay with him while he recovers.  Past Medical History:  Diagnosis Date  . Anterior cruciate ligament complete tear    left    Past Surgical History:  Procedure Laterality Date  . WISDOM TOOTH EXTRACTION      No family history on file. Social History:  reports that he has never smoked. He has never used smokeless tobacco. He reports that he drinks about 3.0 oz of alcohol per week . He reports that he does not use drugs.  Allergies: No Known Allergies  No prescriptions prior to admission.    No results found for this or any previous visit (from the past 48 hour(s)). No results found.  Review of Systems  Musculoskeletal: Positive for joint pain.  All other systems reviewed and are negative.   There were no vitals taken for this visit. Physical Exam  Constitutional: He appears well-developed.  HENT:  Head: Normocephalic.  Eyes: Pupils are equal, round, and reactive to light.  Cardiovascular: Normal rate.   Respiratory: Effort normal.  Neurological: He is alert.  Skin: Skin is warm.  Psychiatric: He has a normal mood and affect.  Left knee demonstrates good range of motion with no posterior lateral rotatory instability.  Extensor mechanism is intact.  Pedal pulses palpable.  Collaterals are stable at 0 and 30 to varus and valgus stress.  Anterior cruciate ligament is out PCL is intact   Assessment/Plan Impression is left knee anterior cruciate ligament tear.  Plan anterior cruciate ligament reconstruction using hamstring autograft.  Risks  and benefits discussed including but not limited to infection or vessel damage weakness as well as the prolonged recovery involved prior to returning to cutting and pivoting sports.  Time out of work also discussed.  All questions answered.  Burnard BuntingG Scott Zarai Orsborn, MD 05/13/2016, 2:03 PM

## 2016-05-15 MED ORDER — CEFAZOLIN SODIUM-DEXTROSE 2-4 GM/100ML-% IV SOLN
2.0000 g | INTRAVENOUS | Status: AC
  Administered 2016-05-16: 2 g via INTRAVENOUS
  Filled 2016-05-15: qty 100

## 2016-05-16 ENCOUNTER — Encounter (HOSPITAL_COMMUNITY): Payer: Self-pay | Admitting: Certified Registered"

## 2016-05-16 ENCOUNTER — Encounter (HOSPITAL_COMMUNITY)
Admission: RE | Disposition: A | Discharge status: Home / Self Care | Payer: Self-pay | Source: Ambulatory Visit | Attending: Orthopedic Surgery

## 2016-05-16 ENCOUNTER — Ambulatory Visit (HOSPITAL_COMMUNITY): Payer: Managed Care, Other (non HMO) | Admitting: Certified Registered"

## 2016-05-16 ENCOUNTER — Ambulatory Visit (HOSPITAL_COMMUNITY)
Admission: RE | Admit: 2016-05-16 | Discharge: 2016-05-16 | Disposition: A | Discharge status: Home / Self Care | Payer: Managed Care, Other (non HMO) | Source: Ambulatory Visit | Attending: Orthopedic Surgery | Admitting: Orthopedic Surgery

## 2016-05-16 DIAGNOSIS — Z7982 Long term (current) use of aspirin: Secondary | ICD-10-CM | POA: Diagnosis not present

## 2016-05-16 DIAGNOSIS — S83512A Sprain of anterior cruciate ligament of left knee, initial encounter: Secondary | ICD-10-CM | POA: Diagnosis present

## 2016-05-16 DIAGNOSIS — X58XXXA Exposure to other specified factors, initial encounter: Secondary | ICD-10-CM | POA: Insufficient documentation

## 2016-05-16 DIAGNOSIS — Y9367 Activity, basketball: Secondary | ICD-10-CM | POA: Insufficient documentation

## 2016-05-16 DIAGNOSIS — S83512D Sprain of anterior cruciate ligament of left knee, subsequent encounter: Secondary | ICD-10-CM

## 2016-05-16 HISTORY — PX: ANTERIOR CRUCIATE LIGAMENT REPAIR: SHX115

## 2016-05-16 SURGERY — RECONSTRUCTION, KNEE, ACL
Anesthesia: Regional | Site: Knee | Laterality: Left

## 2016-05-16 MED ORDER — FENTANYL CITRATE (PF) 100 MCG/2ML IJ SOLN
INTRAMUSCULAR | Status: AC
  Filled 2016-05-16: qty 4

## 2016-05-16 MED ORDER — HYDROMORPHONE HCL 1 MG/ML IJ SOLN
INTRAMUSCULAR | Status: AC
  Filled 2016-05-16: qty 0.5

## 2016-05-16 MED ORDER — CHLORHEXIDINE GLUCONATE 4 % EX LIQD: 60.0000 mL | Freq: Once | CUTANEOUS | Status: DC

## 2016-05-16 MED ORDER — OXYCODONE HCL 5 MG PO TABS: 5.0000 mg | ORAL_TABLET | ORAL | 0 refills | Status: DC | PRN

## 2016-05-16 MED ORDER — PROPOFOL 10 MG/ML IV BOLUS
INTRAVENOUS | Status: DC | PRN
  Administered 2016-05-16: 200 mg via INTRAVENOUS

## 2016-05-16 MED ORDER — MIDAZOLAM HCL 2 MG/2ML IJ SOLN
INTRAMUSCULAR | Status: AC
  Filled 2016-05-16: qty 2

## 2016-05-16 MED ORDER — METHOCARBAMOL 500 MG PO TABS: 500.0000 mg | ORAL_TABLET | Freq: Three times a day (TID) | ORAL | 0 refills | Status: DC | PRN

## 2016-05-16 MED ORDER — BUPIVACAINE-EPINEPHRINE (PF) 0.5% -1:200000 IJ SOLN
INTRAMUSCULAR | Status: AC
  Filled 2016-05-16: qty 30

## 2016-05-16 MED ORDER — ROCURONIUM BROMIDE 50 MG/5ML IV SOSY
PREFILLED_SYRINGE | INTRAVENOUS | Status: AC
  Filled 2016-05-16: qty 5

## 2016-05-16 MED ORDER — BUPIVACAINE HCL (PF) 0.25 % IJ SOLN
INTRAMUSCULAR | Status: AC
  Filled 2016-05-16: qty 30

## 2016-05-16 MED ORDER — FENTANYL CITRATE (PF) 100 MCG/2ML IJ SOLN
INTRAMUSCULAR | Status: AC
  Administered 2016-05-16: 100 ug
  Filled 2016-05-16: qty 2

## 2016-05-16 MED ORDER — MEPERIDINE HCL 25 MG/ML IJ SOLN: 6.2500 mg | INTRAMUSCULAR | Status: DC | PRN

## 2016-05-16 MED ORDER — CLONIDINE HCL (ANALGESIA) 100 MCG/ML EP SOLN
EPIDURAL | Status: DC | PRN
  Administered 2016-05-16: 1 mL

## 2016-05-16 MED ORDER — LACTATED RINGERS IV SOLN
INTRAVENOUS | Status: DC
Start: 2016-05-16 — End: 2016-05-16
  Administered 2016-05-16 (×2): via INTRAVENOUS

## 2016-05-16 MED ORDER — EPINEPHRINE PF 1 MG/ML IJ SOLN
INTRAMUSCULAR | Status: AC
  Filled 2016-05-16: qty 2

## 2016-05-16 MED ORDER — HYDROMORPHONE HCL 1 MG/ML IJ SOLN
0.2500 mg | INTRAMUSCULAR | Status: DC | PRN
  Administered 2016-05-16 (×2): 0.5 mg via INTRAVENOUS

## 2016-05-16 MED ORDER — OXYCODONE HCL 5 MG PO TABS
ORAL_TABLET | ORAL | Status: AC
  Filled 2016-05-16: qty 1

## 2016-05-16 MED ORDER — MORPHINE SULFATE (PF) 4 MG/ML IV SOLN
INTRAVENOUS | Status: DC | PRN
  Administered 2016-05-16: 4 mg via INTRAVENOUS

## 2016-05-16 MED ORDER — ONDANSETRON HCL 4 MG/2ML IJ SOLN
INTRAMUSCULAR | Status: AC
  Filled 2016-05-16: qty 2

## 2016-05-16 MED ORDER — PROMETHAZINE HCL 25 MG/ML IJ SOLN: 6.2500 mg | INTRAMUSCULAR | Status: DC | PRN

## 2016-05-16 MED ORDER — MORPHINE SULFATE (PF) 4 MG/ML IV SOLN
INTRAVENOUS | Status: AC
  Filled 2016-05-16: qty 1

## 2016-05-16 MED ORDER — PROPOFOL 10 MG/ML IV BOLUS
INTRAVENOUS | Status: AC
  Filled 2016-05-16: qty 20

## 2016-05-16 MED ORDER — ASPIRIN EC 325 MG PO TBEC: 325.0000 mg | DELAYED_RELEASE_TABLET | Freq: Every day | ORAL | 0 refills | Status: DC

## 2016-05-16 MED ORDER — FENTANYL CITRATE (PF) 100 MCG/2ML IJ SOLN
INTRAMUSCULAR | Status: DC | PRN
  Administered 2016-05-16 (×2): 50 ug via INTRAVENOUS

## 2016-05-16 MED ORDER — KETOROLAC TROMETHAMINE 30 MG/ML IJ SOLN
30.0000 mg | Freq: Once | INTRAMUSCULAR | Status: AC | PRN
  Administered 2016-05-16: 30 mg via INTRAVENOUS

## 2016-05-16 MED ORDER — MIDAZOLAM HCL 2 MG/2ML IJ SOLN
INTRAMUSCULAR | Status: AC
  Administered 2016-05-16: 2 mg
  Filled 2016-05-16: qty 2

## 2016-05-16 MED ORDER — BUPIVACAINE HCL (PF) 0.25 % IJ SOLN
INTRAMUSCULAR | Status: DC | PRN
  Administered 2016-05-16: 20 mL

## 2016-05-16 MED ORDER — OXYCODONE HCL 5 MG PO TABS
5.0000 mg | ORAL_TABLET | Freq: Once | ORAL | Status: AC | PRN
  Administered 2016-05-16: 5 mg via ORAL

## 2016-05-16 MED ORDER — LIDOCAINE 2% (20 MG/ML) 5 ML SYRINGE
INTRAMUSCULAR | Status: DC | PRN
  Administered 2016-05-16: 100 mg via INTRAVENOUS

## 2016-05-16 MED ORDER — OXYCODONE HCL 5 MG/5ML PO SOLN: 5.0000 mg | Freq: Once | ORAL | Status: AC | PRN

## 2016-05-16 MED ORDER — KETOROLAC TROMETHAMINE 30 MG/ML IJ SOLN
INTRAMUSCULAR | Status: AC
  Filled 2016-05-16: qty 1

## 2016-05-16 MED ORDER — BUPIVACAINE-EPINEPHRINE (PF) 0.5% -1:200000 IJ SOLN
INTRAMUSCULAR | Status: DC | PRN
  Administered 2016-05-16: 27 mL via PERINEURAL

## 2016-05-16 MED ORDER — SODIUM CHLORIDE 0.9 % IR SOLN
Status: DC | PRN
  Administered 2016-05-16 (×2): 3000 mL

## 2016-05-16 MED ORDER — LIDOCAINE 2% (20 MG/ML) 5 ML SYRINGE
INTRAMUSCULAR | Status: AC
  Filled 2016-05-16: qty 5

## 2016-05-16 MED ORDER — ONDANSETRON HCL 4 MG/2ML IJ SOLN
INTRAMUSCULAR | Status: DC | PRN
  Administered 2016-05-16: 4 mg via INTRAVENOUS

## 2016-05-16 MED ORDER — 0.9 % SODIUM CHLORIDE (POUR BTL) OPTIME
TOPICAL | Status: DC | PRN
  Administered 2016-05-16: 1000 mL

## 2016-05-16 SURGICAL SUPPLY — 80 items
ANCHOR BUTTON TIGHTROPE ACL RT (Orthopedic Implant) ×3 IMPLANT
BANDAGE ELASTIC 4 VELCRO ST LF (GAUZE/BANDAGES/DRESSINGS) ×3 IMPLANT
BANDAGE ELASTIC 6 VELCRO ST LF (GAUZE/BANDAGES/DRESSINGS) ×3 IMPLANT
BANDAGE ESMARK 6X9 LF (GAUZE/BANDAGES/DRESSINGS) ×1 IMPLANT
BLADE CUTTER GATOR 3.5 (BLADE) ×3 IMPLANT
BLADE GREAT WHITE 4.2 (BLADE) ×2 IMPLANT
BLADE GREAT WHITE 4.2MM (BLADE) ×1
BLADE SURG 10 STRL SS (BLADE) ×3 IMPLANT
BLADE SURG 15 STRL LF DISP TIS (BLADE) ×2 IMPLANT
BLADE SURG 15 STRL SS (BLADE) ×4
BNDG ELASTIC 6X15 VLCR STRL LF (GAUZE/BANDAGES/DRESSINGS) ×3 IMPLANT
BNDG ESMARK 6X9 LF (GAUZE/BANDAGES/DRESSINGS) ×3
BONE MATRIX DEMINERALIZED 1CC (Bone Implant) ×6 IMPLANT
BUR OVAL 6.0 (BURR) ×3 IMPLANT
CLOSURE WOUND 1/2 X4 (GAUZE/BANDAGES/DRESSINGS) ×2
COVER SURGICAL LIGHT HANDLE (MISCELLANEOUS) ×3 IMPLANT
CUFF TOURNIQUET SINGLE 34IN LL (TOURNIQUET CUFF) ×3 IMPLANT
CUFF TOURNIQUET SINGLE 44IN (TOURNIQUET CUFF) IMPLANT
DECANTER SPIKE VIAL GLASS SM (MISCELLANEOUS) ×3 IMPLANT
DRAPE ARTHROSCOPY W/POUCH 114 (DRAPES) ×3 IMPLANT
DRAPE INCISE IOBAN 66X45 STRL (DRAPES) ×3 IMPLANT
DRAPE U-SHAPE 47X51 STRL (DRAPES) ×3 IMPLANT
DRILL FLIPCUTTER II 8.5MM (INSTRUMENTS) ×1 IMPLANT
DRILL FLIPCUTTER II 9.0MM (INSTRUMENTS) ×1 IMPLANT
DRSG PAD ABDOMINAL 8X10 ST (GAUZE/BANDAGES/DRESSINGS) ×9 IMPLANT
DRSG TEGADERM 4X4.75 (GAUZE/BANDAGES/DRESSINGS) ×9 IMPLANT
ELECT REM PT RETURN 9FT ADLT (ELECTROSURGICAL) ×3
ELECTRODE REM PT RTRN 9FT ADLT (ELECTROSURGICAL) ×1 IMPLANT
FLIPCUTTER II 8.5MM (INSTRUMENTS) ×3
FLIPCUTTER II 9.0MM (INSTRUMENTS) ×3
GAUZE SPONGE 4X4 12PLY STRL (GAUZE/BANDAGES/DRESSINGS) ×3 IMPLANT
GAUZE XEROFORM 1X8 LF (GAUZE/BANDAGES/DRESSINGS) ×3 IMPLANT
GLOVE BIO SURGEON ST LM GN SZ9 (GLOVE) ×3 IMPLANT
GLOVE BIOGEL PI IND STRL 8 (GLOVE) ×1 IMPLANT
GLOVE BIOGEL PI INDICATOR 8 (GLOVE) ×2
GLOVE ECLIPSE 7.0 STRL STRAW (GLOVE) ×3 IMPLANT
GLOVE SURG ORTHO 8.0 STRL STRW (GLOVE) ×3 IMPLANT
GOWN STRL REUS W/ TWL LRG LVL3 (GOWN DISPOSABLE) ×3 IMPLANT
GOWN STRL REUS W/ TWL XL LVL3 (GOWN DISPOSABLE) IMPLANT
GOWN STRL REUS W/TWL LRG LVL3 (GOWN DISPOSABLE) ×6
GOWN STRL REUS W/TWL XL LVL3 (GOWN DISPOSABLE)
KIT BASIN OR (CUSTOM PROCEDURE TRAY) ×3 IMPLANT
KIT BIOCARTILAGE DEL W/SYRINGE (KITS) ×3 IMPLANT
KIT ROOM TURNOVER OR (KITS) ×3 IMPLANT
MANIFOLD NEPTUNE II (INSTRUMENTS) ×3 IMPLANT
NEEDLE 18GX1X1/2 (RX/OR ONLY) (NEEDLE) ×3 IMPLANT
NS IRRIG 1000ML POUR BTL (IV SOLUTION) ×3 IMPLANT
PACK ARTHROSCOPY DSU (CUSTOM PROCEDURE TRAY) ×3 IMPLANT
PAD ARMBOARD 7.5X6 YLW CONV (MISCELLANEOUS) ×6 IMPLANT
PAD CAST 4YDX4 CTTN HI CHSV (CAST SUPPLIES) ×1 IMPLANT
PADDING CAST COTTON 4X4 STRL (CAST SUPPLIES) ×2
PADDING CAST COTTON 6X4 STRL (CAST SUPPLIES) ×9 IMPLANT
PENCIL BUTTON HOLSTER BLD 10FT (ELECTRODE) IMPLANT
PK GRAFTLINK AUTO IMPLANT SYST (Anchor) ×3 IMPLANT
SET ARTHROSCOPY TUBING (MISCELLANEOUS) ×2
SET ARTHROSCOPY TUBING LN (MISCELLANEOUS) ×1 IMPLANT
SPONGE LAP 4X18 X RAY DECT (DISPOSABLE) ×6 IMPLANT
SPONGE SCRUB IODOPHOR (GAUZE/BANDAGES/DRESSINGS) ×3 IMPLANT
STRIP CLOSURE SKIN 1/2X4 (GAUZE/BANDAGES/DRESSINGS) ×4 IMPLANT
SUCTION FRAZIER HANDLE 10FR (MISCELLANEOUS) ×2
SUCTION TUBE FRAZIER 10FR DISP (MISCELLANEOUS) ×1 IMPLANT
SUT 2 FIBERLOOP 20 STRT BLUE (SUTURE) ×3
SUT ETHILON 3 0 PS 1 (SUTURE) ×6 IMPLANT
SUT MENISCAL KIT (KITS) IMPLANT
SUT MNCRL AB 3-0 PS2 18 (SUTURE) ×3 IMPLANT
SUT VIC AB 0 CT1 27 (SUTURE) ×2
SUT VIC AB 0 CT1 27XBRD ANBCTR (SUTURE) ×1 IMPLANT
SUT VIC AB 2-0 CT1 27 (SUTURE) ×2
SUT VIC AB 2-0 CT1 TAPERPNT 27 (SUTURE) ×1 IMPLANT
SUTURE 2 FIBERLOOP 20 STRT BLU (SUTURE) ×1 IMPLANT
SYR 30ML LL (SYRINGE) ×3 IMPLANT
SYR BULB IRRIGATION 50ML (SYRINGE) ×3 IMPLANT
SYR TB 1ML LUER SLIP (SYRINGE) ×3 IMPLANT
SYSTEM GRAFT IMPLANT AUTOGRAFT (Anchor) ×1 IMPLANT
TOWEL OR 17X24 6PK STRL BLUE (TOWEL DISPOSABLE) ×3 IMPLANT
TOWEL OR 17X26 10 PK STRL BLUE (TOWEL DISPOSABLE) ×6 IMPLANT
UNDERPAD 30X30 (UNDERPADS AND DIAPERS) ×3 IMPLANT
WAND STAR VAC 90 (SURGICAL WAND) ×3 IMPLANT
WATER STERILE IRR 1000ML POUR (IV SOLUTION) ×3 IMPLANT
WRAP KNEE MAXI GEL POST OP (GAUZE/BANDAGES/DRESSINGS) ×3 IMPLANT

## 2016-05-16 NOTE — Brief Op Note (Signed)
05/16/2016  1:14 PM  PATIENT:  Tyler Lozano  26 y.o. male  PRE-OPERATIVE DIAGNOSIS:  LEFT KNEE ANTERIOR CRUCIATE LIGAMENT TEAR  POST-OPERATIVE DIAGNOSIS:  LEFT KNEE ANTERIOR CRUCIATE LIGAMENT TEAR  PROCEDURE:  Procedure(s): RECONSTRUCTION ANTERIOR CRUCIATE LIGAMENT (ACL)  SURGEON:  Surgeon(s): Cammy CopaScott Gregory Dean, MD  ASSISTANT: Patrick Jupiterarla Bethune RNFA  ANESTHESIA:   general  EBL: 10 ml    Total I/O In: 1000 [I.V.:1000] Out: 30 [Blood:30]  BLOOD ADMINISTERED: none  DRAINS: none   LOCAL MEDICATIONS USED:  Marcaine morphine clonidine  SPECIMEN:  No Specimen  COUNTS:  YES  TOURNIQUET:    DICTATION: .Other Dictation: Dictation Number [id number]  PLAN OF CARE: Discharge to home after PACU  PATIENT DISPOSITION:  PACU - hemodynamically stable

## 2016-05-16 NOTE — Op Note (Signed)
[name], [name] NO.:  [id number]  MEDICAL RECORD NO.:  [id number]  LOCATION:  [facility]                         FACILITY:  [facility]  PHYSICIAN:  Tyler Lozano, M.D.    DATE OF BIRTH:  1991-02-12  DATE OF PROCEDURE: DATE OF DISCHARGE:                              OPERATIVE REPORT   PREOPERATIVE DIAGNOSIS:  Left knee anterior cruciate ligament tear.  POSTOPERATIVE DIAGNOSIS:  Left knee anterior cruciate ligament tear.  PROCEDURE:  Left knee ACL reconstruction using hamstring autograft, Arthrex dual EndoButton fixation technique, 9 mm graft.  SURGEON:  Tyler Lozano, M.D.  ASSISTANT:  Tyler Lozano, RNFA.  INDICATIONS:  Tyler Lozano is a 26 year old patient with left knee pain and instability following injury.  MRI scan shows ACL tear.  Menisci intact, presents now for operative management after explanation of risks and benefits.  OPERATIVE FINDINGS: 1. Examination under anesthesia, range of motion full extension to     full flexion on the left-hand side with stability to varus and     valgus stress at 0 and 30 degrees.  PCL intact.  No posterolateral     rotatory instability noted.  ACL was out with positive Lachman's,     positive pivot shift. 2. Diagnostic and operative arthroscopy.     a.     Intact patellofemoral compartment.     b.     No loose bodies in the medial and lateral gutter.     c.     Intact medial compartment articular cartilage and meniscus.     d.     Intact PCL, torn ACL.     e.     Intact lateral compartment articular cartilage and meniscus.  DESCRIPTION OF PROCEDURE:  The patient was brought to the operating room where general anesthetic was induced.  Preoperative antibiotics administered.  Time-out was called.  Left leg was prescrubbed with alcohol and Betadine, allowed to air dry.  Prepped with DuraPrep solution and draped in a sterile manner.  Time-out was called. Examination under anesthesia demonstrated significant ACL laxity.   There was no collateral ligament laxity.  An incision was made over the pes bursa tendons about 2 cm distal and medial to the tibial tubercle.  Skin and subcu tissue were sharply divided.  Semitendinosus tendon was dissected from the surrounding hamstring tendons.  The semitendinosus was then harvested and prepared using dual EndoButton technique on the back table to a size of 9 mm.  At this time, concurrent with graft preparation, thorough irrigation was performed on the donor site incision.  Arthroscopy was then performed through anteroinferior, lateral anterior, inferomedial portals.  Diagnostic arthroscopy demonstrated intact medial, lateral, and patellofemoral compartments with torn ACL.  ACL stump debrided.  Notchplasty performed over-the-top position, identified.  The flip cutter was then used to drill a 9 mm tunnel at the 3 o'clock position.  In a similar fashion, the flip cutter was used to drill the tibial tunnel in the native ACL footprint.  The graft was then passed and secured on the femoral side with StimuBlast in the socket.  It was then taken through range of motion, found to have good isometry and secured on the tibial  side in full extension also using EndoButton technique.  The patient had excellent graft stability, excellent range of motion.  Thorough irrigation was performed of the knee joint as well as the donor incision.  Donor incision closed using 0 Vicryl suture, 2-0 Vicryl suture, and then 3-0 Monocryl.  Portals were closed using 2-0 Vicryl and 3-0 nylon.  Solution of Marcaine, morphine, and clonidine injected into the knee.  Bulky dressing was applied along with ice pack and knee immobilizer.  The patient tolerated the procedure well without immediate complication, transferred to the recovery room in stable condition.     Tyler BuntingG. Lozano Tyler Lozano, M.D.     GSD/MEDQ  D:  05/16/2016  T:  05/16/2016  Job:  [id number]

## 2016-05-16 NOTE — Anesthesia Procedure Notes (Signed)
Procedure Name: LMA Insertion Date/Time: 05/16/2016 10:54 AM Performed by: Charm BargesBUTLER, Shahed Yeoman R Pre-anesthesia Checklist: Patient identified, Emergency Drugs available, Suction available and Patient being monitored Patient Re-evaluated:Patient Re-evaluated prior to inductionOxygen Delivery Method: Circle System Utilized Preoxygenation: Pre-oxygenation with 100% oxygen Intubation Type: IV induction Ventilation: Mask ventilation without difficulty LMA: LMA inserted LMA Size: 5.0 Number of attempts: 1 Placement Confirmation: positive ETCO2 Tube secured with: Tape Dental Injury: Teeth and Oropharynx as per pre-operative assessment

## 2016-05-16 NOTE — Anesthesia Postprocedure Evaluation (Signed)
Anesthesia Post Note  Patient: Tyler Lozano  Procedure(s) Performed: Procedure(s) (LRB): RECONSTRUCTION ANTERIOR CRUCIATE LIGAMENT (ACL) (Left)  Patient location during evaluation: PACU Anesthesia Type: Regional and General Level of consciousness: sedated and patient cooperative Pain management: pain level controlled Vital Signs Assessment: post-procedure vital signs reviewed and stable Respiratory status: spontaneous breathing Cardiovascular status: stable Anesthetic complications: no       Last Vitals:  Vitals:   05/16/16 1445 05/16/16 1500  BP: (!) 104/59   Pulse: 85 87  Resp: (!) 9 11  Temp:  36.3 C    Last Pain:  Vitals:   05/16/16 1430  TempSrc:   PainSc: Asleep                 [name]

## 2016-05-16 NOTE — Anesthesia Preprocedure Evaluation (Signed)
Anesthesia Evaluation  Patient identified by MRN, date of birth, ID band Patient awake    Reviewed: Allergy & Precautions, NPO status , Patient's Chart, lab work & pertinent test results  Airway Mallampati: II  TM Distance: >3 FB Neck ROM: Full    Dental no notable dental hx.    Pulmonary neg pulmonary ROS,    Pulmonary exam normal breath sounds clear to auscultation       Cardiovascular negative cardio ROS Normal cardiovascular exam Rhythm:Regular Rate:Normal     Neuro/Psych negative neurological ROS  negative psych ROS   GI/Hepatic negative GI ROS, Neg liver ROS,   Endo/Other  negative endocrine ROS  Renal/GU negative Renal ROS  negative genitourinary   Musculoskeletal negative musculoskeletal ROS (+)   Abdominal   Peds  Hematology negative hematology ROS (+)   Anesthesia Other Findings   Reproductive/Obstetrics negative OB ROS                             Anesthesia Physical Anesthesia Plan  ASA: I  Anesthesia Plan: General and Regional   Post-op Pain Management: GA combined w/ Regional for post-op pain   Induction: Intravenous  Airway Management Planned: LMA  Additional Equipment:   Intra-op Plan:   Post-operative Plan: Extubation in OR  Informed Consent: I have reviewed the patients History and Physical, chart, labs and discussed the procedure including the risks, benefits and alternatives for the proposed anesthesia with the patient or authorized representative who has indicated his/her understanding and acceptance.   Dental advisory given  Plan Discussed with: CRNA  Anesthesia Plan Comments:         Anesthesia Quick Evaluation

## 2016-05-16 NOTE — Transfer of Care (Signed)
Immediate Anesthesia Transfer of Care Note  Patient: Tyler Lozano  Procedure(s) Performed: Procedure(s): RECONSTRUCTION ANTERIOR CRUCIATE LIGAMENT (ACL) (Left)  Patient Location: PACU  Anesthesia Type:GA combined with regional for post-op pain  Level of Consciousness: awake, oriented and patient cooperative  Airway & Oxygen Therapy: Patient Spontanous Breathing and Patient connected to nasal cannula oxygen  Post-op Assessment: Report given to RN, Post -op Vital signs reviewed and stable and Patient moving all extremities  Post vital signs: Reviewed and stable  Last Vitals:  Vitals:   05/16/16 0924  BP: 123/75  Pulse: 80  Resp: 20  Temp: 36.7 C    Last Pain:  Vitals:   05/16/16 0924  TempSrc: Oral         Complications: No apparent anesthesia complications

## 2016-05-18 MED ORDER — BUPIVACAINE-EPINEPHRINE (PF) 0.5% -1:200000 IJ SOLN
INTRAMUSCULAR | Status: DC | PRN
  Administered 2016-05-16: 30 mL via PERINEURAL

## 2016-05-18 NOTE — Anesthesia Procedure Notes (Signed)
Anesthesia Regional Block: Adductor canal block   Pre-Anesthetic Checklist: ,, timeout performed, Correct Patient, Correct Site, Correct Laterality, Correct Procedure, Correct Position, site marked, Risks and benefits discussed,  Surgical consent,  Pre-op evaluation,  At surgeon's request and post-op pain management  Laterality: Right and Left  Prep: chloraprep       Needles:  Injection technique: Single-shot  Needle Type: Stimiplex     Needle Length: 9cm  Needle Gauge: 21     Additional Needles:   Procedures: ultrasound guided,,,,,,,,  Narrative:  Start time: 05/18/2016 10:24 AM End time: 05/18/2016 10:29 AM Injection made incrementally with aspirations every 5 mL.  Performed by: Personally  Anesthesiologist: Lewie LoronGERMEROTH, Jalesha Plotz  Additional Notes: BP cuff, EKG monitors applied. Sedation begun. Artery and nerve location verified with U/S and anesthetic injected incrementally, slowly, and after negative aspirations under direct u/s guidance. Good fascial /perineural spread. Tolerated well.

## 2016-05-18 NOTE — Addendum Note (Signed)
Addendum  created 05/18/16 1852 by Lewie LoronJohn Alin Chavira, MD   Anesthesia Intra Blocks edited, Anesthesia Intra Meds edited, Child order released for a procedure order, Sign clinical note

## 2016-05-22 ENCOUNTER — Encounter (HOSPITAL_COMMUNITY): Payer: Self-pay | Admitting: Orthopedic Surgery

## 2016-05-24 ENCOUNTER — Ambulatory Visit (INDEPENDENT_AMBULATORY_CARE_PROVIDER_SITE_OTHER): Payer: Self-pay | Admitting: Orthopedic Surgery

## 2016-05-24 ENCOUNTER — Encounter (INDEPENDENT_AMBULATORY_CARE_PROVIDER_SITE_OTHER): Payer: Self-pay | Admitting: Orthopedic Surgery

## 2016-05-24 DIAGNOSIS — S83512D Sprain of anterior cruciate ligament of left knee, subsequent encounter: Secondary | ICD-10-CM

## 2016-05-24 NOTE — Progress Notes (Signed)
   Post-Op Visit Note   Patient: Tyler Lozano           Date of Birth: 03/05/1991           MRN: [id number] Visit Date: 05/24/2016 PCP: Ernst BreachYSINGER, DAVID SHANE, PA-C   Assessment & Plan:  Chief Complaint: No chief complaint on file.  Visit Diagnoses: No diagnosis found.  Plan: Tyler Lozano is a 26 year old patient is a week out left knee anterior cruciate ligament reconstruction.  He's doing well.  5 flexion contracture can bend to 90 graft stable portal sutures removed plan is to start physical therapy for parties of #1 full extension #2 flexion #3 quad strengthening number for home exercise program.  He is in a knee immobilizer but that's fine for him to be weightbearing as tolerated in the knee immobilizer.  Anticipate discontinuing the knee immobilizer at the end of the weekend.  Follow-up in 3 weeks.  He's taking ibuprofen for pain.  I do want him to continue aspirin 325 mg a day for the next 3 weeks.  No calf tenderness today.  Follow-Up Instructions: No Follow-up on file.   Orders:  No orders of the defined types were placed in this encounter.  No orders of the defined types were placed in this encounter.   Imaging: No results found.  PMFS History: Patient Active Problem List   Diagnosis Date Noted  . Sprain of anterior cruciate ligament of left knee 01/25/2016  . Left knee pain 12/17/2015  . Left knee injury 12/17/2015   Past Medical History:  Diagnosis Date  . Anterior cruciate ligament complete tear    left    No family history on file.  Past Surgical History:  Procedure Laterality Date  . ANTERIOR CRUCIATE LIGAMENT REPAIR Left 05/16/2016   Procedure: RECONSTRUCTION ANTERIOR CRUCIATE LIGAMENT (ACL);  Surgeon: Cammy CopaScott Jillene Wehrenberg, MD;  [facility];  Service: Orthopedics;  Laterality: Left;  . WISDOM TOOTH EXTRACTION     Social History   Occupational History  . Not on file.   Social History Main Topics  . Smoking status: Never Smoker  . Smokeless tobacco: Never  Used  . Alcohol use 3.0 oz/week    5 Shots of liquor per week     Comment: weekend drinker -  x5 drinks   . Drug use: No  . Sexual activity: Not on file

## 2016-05-24 NOTE — Addendum Note (Signed)
Addended byCherre Huger: Jackalyn Haith on: 05/24/2016 09:28 AM   Modules accepted: Orders

## 2016-05-25 ENCOUNTER — Ambulatory Visit: Payer: Managed Care, Other (non HMO) | Attending: Orthopedic Surgery | Admitting: Physical Therapy

## 2016-05-25 ENCOUNTER — Encounter: Payer: Self-pay | Admitting: Physical Therapy

## 2016-05-25 ENCOUNTER — Telehealth (INDEPENDENT_AMBULATORY_CARE_PROVIDER_SITE_OTHER): Payer: Self-pay | Admitting: Orthopedic Surgery

## 2016-05-25 DIAGNOSIS — R262 Difficulty in walking, not elsewhere classified: Secondary | ICD-10-CM

## 2016-05-25 DIAGNOSIS — M25562 Pain in left knee: Secondary | ICD-10-CM

## 2016-05-25 DIAGNOSIS — M6281 Muscle weakness (generalized): Secondary | ICD-10-CM | POA: Insufficient documentation

## 2016-05-25 DIAGNOSIS — M25662 Stiffness of left knee, not elsewhere classified: Secondary | ICD-10-CM | POA: Diagnosis present

## 2016-05-25 NOTE — Telephone Encounter (Signed)
Okay to 7 and the old protocol bone patella tendon bone graft

## 2016-05-25 NOTE — Telephone Encounter (Signed)
What do I send them?

## 2016-05-25 NOTE — Therapy (Signed)
[facility] [address] [place], [place], [address] Phone: [phone]   Fax:  [phone]  Physical Therapy Evaluation  Patient Details  Name: Tyler Lozano MRN: [id number] Date of Birth: 06/08/1990 Referring Provider: Cammy Copa MD  Encounter Date: 05/25/2016      PT End of Session - 05/25/16 1027    Visit Number 1   Date for PT Re-Evaluation 07/20/16   PT Start Time 0800   PT Stop Time 0849   PT Time Calculation (min) 49 min   Activity Tolerance Patient tolerated treatment well   Behavior During Therapy [facility] for tasks assessed/performed      Past Medical History:  Diagnosis Date  . Anterior cruciate ligament complete tear    left    Past Surgical History:  Procedure Laterality Date  . ANTERIOR CRUCIATE LIGAMENT REPAIR Left 05/16/2016   Procedure: RECONSTRUCTION ANTERIOR CRUCIATE LIGAMENT (ACL);  Surgeon: Cammy Copa, MD;  Location: [facility] OR;  Service: Orthopedics;  Laterality: Left;  . WISDOM TOOTH EXTRACTION      There were no vitals filed for this visit.       Subjective Assessment - 05/25/16 0803    Subjective I just want to get back to normal life.  I would love to get back to playing soccer, tennis, swimming, and skiing.   Patient is accompained by: Family member   Limitations Standing;Walking;Other (comment)  sports activities   Patient Stated Goals return to regular activity, walking without crutches, and sports   Currently in Pain? Yes   Pain Score 1    Pain Location Knee   Pain Orientation Left   Pain Descriptors / Indicators Aching   Pain Type Surgical pain   Pain Onset 1 to 4 weeks ago   Pain Frequency Intermittent   Aggravating Factors  moving   Pain Relieving Factors pain medicine and ice   Effect of Pain on Daily Activities walking   Multiple Pain Sites No            [facility] PT Assessment - 05/25/16 0001      Assessment   Medical Diagnosis S83.521D sprain of  left ACL left knee   Referring Provider Cammy Copa MD   Onset Date/Surgical Date 05/16/16     Precautions   Precautions Knee     Restrictions   Weight Bearing Restrictions Yes     Balance Screen   Has the patient fallen in the past 6 months No     Home Environment   Living Environment Private residence   Available Help at Discharge Family  Dad staying with him currently     Prior Function   Level of Independence Independent   Vocation Full time employment     Observation/Other Assessments   Focus on Therapeutic Outcomes (FOTO)  70% limited  goal 39% limited     Circumferential Edema   Circumferential - Right 38 cm  knee joint midline around patella   Circumferential - Left  41 cm  knee joint midline around patella     Posture/Postural Control   Posture/Postural Control No significant limitations     AROM   AROM Assessment Site Knee   Right/Left Knee Right;Left   Left Knee Extension 3   Left Knee Flexion 76     Strength   Right Hip Flexion 5/5   Right Hip ABduction 5/5   Left Hip Flexion 4-/5   Left Hip ABduction 4-/5   Right/Left Knee Right;Left  Right Knee Flexion 5/5   Right Knee Extension 5/5   Left Knee Flexion 3+/5   Left Knee Extension 2/5     Palpation   Patella mobility moves well in all planes, swelling limiting mobility     Ambulation/Gait   Assistive device Crutches   Gait Pattern Left foot flat;Step-to pattern                   [facility] Adult PT Treatment/Exercise - 05/25/16 0001      Cryotherapy   Number Minutes Cryotherapy 15 Minutes   Cryotherapy Location Knee   Type of Cryotherapy Ice pack     Electrical Stimulation   Electrical Stimulation Location knee   Electrical Stimulation Action IFC   Electrical Stimulation Parameters 15 minutes to tolerance (level 17)   Electrical Stimulation Goals Edema;Pain     Ankle Exercises: Seated   Other Seated Ankle Exercises plantarflexion with red band - 20x                 PT Education - 05/25/16 0914    Education provided Yes   Education Details quad sets, straight leg raise, LAQ to 45 deg, plantarflexion red band   Person(s) Educated Patient;Parent(s)   Methods Explanation;Demonstration;Verbal cues;Handout   Comprehension Verbalized understanding;Returned demonstration          PT Short Term Goals - 05/25/16 1049      PT SHORT TERM GOAL #1   Title pt will be indpendent in his HEP   Time 4   Period Weeks   Status New     PT SHORT TERM GOAL #2   Title Able to ambulate with good heel strike without crutches   Time 4   Period Weeks   Status New     PT SHORT TERM GOAL #3   Title improved AROM from 0-100 degrees for improved functional mobility such as going up and down stairs   Time 4   Period Weeks   Status New           PT Long Term Goals - 05/25/16 1051      PT LONG TERM GOAL #1   Title Pt will be independent with a HEP and safe gym exercises and use of equipment.   Time 8   Period Weeks   Status New     PT LONG TERM GOAL #2   Title Pt will be able to descend the stairs with no pain using no hand rails with step over step pattern.   Time 8   Period Weeks   Status New     PT LONG TERM GOAL #3   Title Pt will improve his FOTO from 70% limiation to </= 39% limitation.    Time 8   Period Weeks   Status New     PT LONG TERM GOAL #4   Title Patient will have left knee flexion within 10 degrees of right knee for return to sports such as soccer   Time 8   Period Weeks   Status New     PT LONG TERM GOAL #5   Title Quad and hamstring strength improved to 5/5 MMT for improved stability when walking and returning to active lifestyle   Time 8   Period Weeks   Status New               Plan - 05/25/16 1035    Clinical Impression Statement Patient presents to clinic s/p ACL repair with hamstring graft.  Patient  is active 26 y/o male and wants to get back to sports.  Patient was low complexity eval  due to standard healing protocol  and no comorbidities.  He is managing pain currntly with ice and pain medicine with mild 1/10 pain sitting still.  Patient has LE weakness on left 2/5 to 4/5 of left knee/hip.  Patient has decreased AROM from 3 to 76 degrees of flexion on left LE.  Edema is present with circumferential measurement 41cm on left knee compared to 38 cm on right knee.  These deficits are causing limited functional abilities and gait deficits.  Skilled PT needed to address these issues so patient can return to prior level of function.   Rehab Potential Excellent   Clinical Impairments Affecting Rehab Potential none   PT Frequency 2x / week   PT Duration 8 weeks   PT Treatment/Interventions ADLs/Self Care Home Management;Biofeedback;Cryotherapy;Lawyer;Therapeutic activities;Therapeutic exercise;Neuromuscular re-education;Balance training;Patient/family education;Manual techniques;Scar mobilization;Passive range of motion;Dry needling;Taping;Vasopneumatic Device   PT Next Visit Plan gentle quad strengthening, ROM flexion/extension, WBAT, vasopnuematic or stim/ice, [place] stim if needed for quad firing   PT Home Exercise Plan progress as needed   Recommended Other Services none   Consulted and Agree with Plan of Care Patient      Patient will benefit from skilled therapeutic intervention in order to improve the following deficits and impairments:  Abnormal gait, Decreased activity tolerance, Decreased endurance, Decreased mobility, Decreased range of motion, Decreased strength, Difficulty walking, Increased edema, Impaired flexibility, Pain  Visit Diagnosis: Stiffness of left knee, not elsewhere classified - Plan: PT plan of care cert/re-cert  Acute pain of left knee - Plan: PT plan of care cert/re-cert  Difficulty in walking, not elsewhere classified - Plan: PT plan of care cert/re-cert  Muscle weakness (generalized) - Plan: PT plan  of care cert/re-cert     Problem List Patient Active Problem List   Diagnosis Date Noted  . Sprain of anterior cruciate ligament of left knee 01/25/2016  . Left knee pain 12/17/2015  . Left knee injury 12/17/2015    Vincente Poli, PT 05/25/2016, 11:08 AM  [facility] [address] [place], [place], [address] Phone: [phone]   Fax:  [phone]  Name: Tarquin Welcher MRN: [id number] Date of Birth: 10/28/1990

## 2016-05-25 NOTE — Patient Instructions (Signed)
Quad Set    With other leg bent, foot flat, slowly tighten muscles on thigh of straight leg while lifting leg about 6 inches. Repeat ___20_ times. Do __2__ sessions per day.  http://gt2.exer.us/275   Copyright  VHI. All rights reserved.    KNEE: Extension, Long Arc Quads - Sitting    Raise leg until knee is halfway straight. __20_ reps per set, __2_ sets per day, _7__ days per week  Copyright  VHI. All rights reserved.    Advanced Straight Leg Raise    With knees bent, keep one leg on the bed, slowly straighten right leg, keeping quad tight. Repeat ___10_ times per set. Do ___1_ sets per session. Do __2__ sessions per day.  http://orth.exer.us/1108   Copyright  VHI. All rights reserved.  Plantar Flexion: Resisted    Anchor behind, tubing around left foot, press down. Repeat __20__ times per set. Do __1__ sets per session. Do __2Bracing With Heel Slides (supine)     With neutral spine,  slide heel to bottom use strap or towel to pull leg towards you. Hold 10 sec Repeat _10__ times. Do __3_ times a day.  Repeat above without a strap just slide up and back 20x 2x/day   Copyright  VHI. All rights reserved.

## 2016-05-25 NOTE — Telephone Encounter (Signed)
faxed

## 2016-05-25 NOTE — Telephone Encounter (Signed)
[facility] out patient PT called needing a ACL protocol CB # [phone] Fax # [phone]

## 2016-05-30 ENCOUNTER — Ambulatory Visit: Payer: Managed Care, Other (non HMO) | Admitting: Physical Therapy

## 2016-05-30 ENCOUNTER — Encounter: Payer: Self-pay | Admitting: Physical Therapy

## 2016-05-30 DIAGNOSIS — M25662 Stiffness of left knee, not elsewhere classified: Secondary | ICD-10-CM | POA: Diagnosis not present

## 2016-05-30 DIAGNOSIS — M25562 Pain in left knee: Secondary | ICD-10-CM

## 2016-05-30 DIAGNOSIS — M6281 Muscle weakness (generalized): Secondary | ICD-10-CM

## 2016-05-30 DIAGNOSIS — R262 Difficulty in walking, not elsewhere classified: Secondary | ICD-10-CM

## 2016-05-30 NOTE — Therapy (Signed)
[facility] [address], [address] [place], [place], [address] Phone: [phone]   Fax:  [phone]  Physical Therapy Treatment  Patient Details  Name: Tyler Lozano MRN: [id number] Date of Birth: 1990/05/02 Referring Provider: Cammy Copa MD  Encounter Date: 05/30/2016      PT End of Session - 05/30/16 0844    Visit Number 2   Date for PT Re-Evaluation 07/20/16   PT Start Time 0802   PT Stop Time 0845   PT Time Calculation (min) 43 min   Activity Tolerance Patient tolerated treatment well   Behavior During Therapy [facility] for tasks assessed/performed      Past Medical History:  Diagnosis Date  . Anterior cruciate ligament complete tear    left    Past Surgical History:  Procedure Laterality Date  . ANTERIOR CRUCIATE LIGAMENT REPAIR Left 05/16/2016   Procedure: RECONSTRUCTION ANTERIOR CRUCIATE LIGAMENT (ACL);  Surgeon: Cammy Copa, MD;  Location: [facility] OR;  Service: Orthopedics;  Laterality: Left;  . WISDOM TOOTH EXTRACTION      There were no vitals filed for this visit.      Subjective Assessment - 05/30/16 0803    Subjective (P)  Not much pain in the knee today. No pain with weight bearing while walking with crutches.    Patient is accompained by: (P)  Family member   Limitations (P)  Standing;Walking;Other (comment)   Patient Stated Goals (P)  return to regular activity, walking without crutches, and sports   Currently in Pain? (P)  No/denies                         [facility] Adult PT Treatment/Exercise - 05/30/16 0001      Knee/Hip Exercises: Supine   Quad Sets Strengthening;Left;2 sets;10 reps   Heel Slides Both;PROM   Straight Leg Raises Strengthening;Left;1 set;10 reps     Knee/Hip Exercises: Sidelying   Hip ABduction Strengthening;Left;1 set;10 reps   Hip ADduction Strengthening;Left;1 set;10 reps     Modalities   Modalities Vasopneumatic     Vasopneumatic   Number Minutes  Vasopneumatic  15 minutes   Vasopnuematic Location  Knee   Vasopneumatic Pressure Medium   Vasopneumatic Temperature  3 snow flakes     Ankle Exercises: Seated   Other Seated Ankle Exercises plantarflexion with red band - 20x                  PT Short Term Goals - 05/30/16 0844      PT SHORT TERM GOAL #1   Title pt will be indpendent in his HEP   Baseline issued 01/06/16   Time 4   Period Weeks   Status On-going           PT Long Term Goals - 05/25/16 1051      PT LONG TERM GOAL #1   Title Pt will be independent with a HEP and safe gym exercises and use of equipment.   Time 8   Period Weeks   Status New     PT LONG TERM GOAL #2   Title Pt will be able to descend the stairs with no pain using no hand rails with step over step pattern.   Time 8   Period Weeks   Status New     PT LONG TERM GOAL #3   Title Pt will improve his FOTO from 70% limiation to </= 39% limitation.    Time 8  Period Weeks   Status New     PT LONG TERM GOAL #4   Title Patient will have left knee flexion within 10 degrees of right knee for return to sports such as soccer   Time 8   Period Weeks   Status New     PT LONG TERM GOAL #5   Title Quad and hamstring strength improved to 5/5 MMT for improved stability when walking and returning to active lifestyle   Time 8   Period Weeks   Status New               Plan - 05/30/16 0845    Clinical Impression Statement Pt presents with Bil axilary crutches with step through pattern WBAT on Lt leg. Pt denies pain with weight bearing or weight shiting. Pt has good quad contraction and able to maintain knee extension with straight leg raises. Pt will continue to benefit from skilled therapy for knee strength and stability.    Rehab Potential Excellent   Clinical Impairments Affecting Rehab Potential none   PT Frequency 2x / week   PT Duration 8 weeks   PT Treatment/Interventions ADLs/Self Care Home  Management;Biofeedback;Cryotherapy;Lawyerlectrical Stimulation;Moist Heat;Ultrasound;Gait training;Therapeutic activities;Therapeutic exercise;Neuromuscular re-education;Balance training;Patient/family education;Manual techniques;Scar mobilization;Passive range of motion;Dry needling;Taping;Vasopneumatic Device   PT Next Visit Plan Continue to strengthen within protocol   Consulted and Agree with Plan of Care Patient      Patient will benefit from skilled therapeutic intervention in order to improve the following deficits and impairments:  Abnormal gait, Decreased activity tolerance, Decreased endurance, Decreased mobility, Decreased range of motion, Decreased strength, Difficulty walking, Increased edema, Impaired flexibility, Pain  Visit Diagnosis: Stiffness of left knee, not elsewhere classified  Acute pain of left knee  Difficulty in walking, not elsewhere classified  Muscle weakness (generalized)     Problem List Patient Active Problem List   Diagnosis Date Noted  . Sprain of anterior cruciate ligament of left knee 01/25/2016  . Left knee pain 12/17/2015  . Left knee injury 12/17/2015    Dessa PhiKatherine Corrinne Benegas PTA 05/30/2016, 1:04 PM  [facility] [address], [address] [place], [place], [address] Phone: [phone]   Fax:  [phone]  Name: Tyler Lozano MRN: [id number] Date of Birth: 08/31/1990

## 2016-06-01 ENCOUNTER — Ambulatory Visit: Payer: Managed Care, Other (non HMO) | Admitting: Physical Therapy

## 2016-06-01 DIAGNOSIS — M6281 Muscle weakness (generalized): Secondary | ICD-10-CM

## 2016-06-01 DIAGNOSIS — M25662 Stiffness of left knee, not elsewhere classified: Secondary | ICD-10-CM | POA: Diagnosis not present

## 2016-06-01 DIAGNOSIS — R262 Difficulty in walking, not elsewhere classified: Secondary | ICD-10-CM

## 2016-06-01 DIAGNOSIS — M25562 Pain in left knee: Secondary | ICD-10-CM

## 2016-06-01 NOTE — Therapy (Signed)
[facility] [address], [address] [place], [place], [address] Phone: [phone]   Fax:  [phone]  Physical Therapy Treatment  Patient Details  Name: Tyler QuakerJoseph Lozano MRN: [id number] Date of Birth: 02/28/1991 Referring Provider: Cammy CopaScott Gregory Dean MD  Encounter Date: 06/01/2016      PT End of Session - 06/01/16 0837    Visit Number 3   Date for PT Re-Evaluation 07/20/16   PT Start Time 0759   PT Stop Time 0855   PT Time Calculation (min) 56 min   Activity Tolerance Patient tolerated treatment well   Behavior During Therapy [facility] for tasks assessed/performed      Past Medical History:  Diagnosis Date  . Anterior cruciate ligament complete tear    left    Past Surgical History:  Procedure Laterality Date  . ANTERIOR CRUCIATE LIGAMENT REPAIR Left 05/16/2016   Procedure: RECONSTRUCTION ANTERIOR CRUCIATE LIGAMENT (ACL);  Surgeon: Cammy CopaScott Gregory Dean, MD;  [facility];  Service: Orthopedics;  Laterality: Left;  . WISDOM TOOTH EXTRACTION      There were no vitals filed for this visit.      Subjective Assessment - 06/01/16 0806    Subjective I'm okay.  ROM is the exercise I'm doing the most.   Currently in Pain? No/denies                         [facility] Treatment/Exercise - 06/01/16 0001      Knee/Hip Exercises: Aerobic   Stationary Bike 7 min for ROM  PT present to discuss treatment     Knee/Hip Exercises: Standing   Functional Squat 15 reps  0-40 degress ROM   SLS weight shift to full weight bearing left leg holding 5 sec x 15; hamstring curl 0-40 deg 15x left leg only     Knee/Hip Exercises: Supine   Short Arc Quad Sets Strengthening;Left;20 reps  5 sec hold   Straight Leg Raises Strengthening;Left;1 set;10 reps     Vasopneumatic   Number Minutes Vasopneumatic  15 minutes   Vasopnuematic Location  Knee   Vasopneumatic Pressure Medium   Vasopneumatic Temperature  3 snow flakes                 PT Education - 06/01/16 0844    Education provided Yes   Person(s) Educated Patient   Methods Explanation;Demonstration;Verbal cues;Handout   Comprehension Verbalized understanding;Returned demonstration          PT Short Term Goals - 06/01/16 0840      PT SHORT TERM GOAL #1   Title pt will be indpendent in his HEP   Baseline independent with initial HEP   Time 4   Period Weeks   Status Achieved     PT SHORT TERM GOAL #2   Title Able to ambulate with good heel strike without crutches   Time 4   Period Weeks   Status On-going     PT SHORT TERM GOAL #3   Title improved AROM from 0-100 degrees for improved functional mobility such as going up and down stairs   Baseline 100 deg AAROM   Period Weeks   Status On-going           PT Long Term Goals - 05/25/16 1051      PT LONG TERM GOAL #1   Title Pt will be independent with a HEP and safe gym exercises and use of equipment.   Time 8  Period Weeks   Status New     PT LONG TERM GOAL #2   Title Pt will be able to descend the stairs with no pain using no hand rails with step over step pattern.   Time 8   Period Weeks   Status New     PT LONG TERM GOAL #3   Title Pt will improve his FOTO from 70% limiation to </= 39% limitation.    Time 8   Period Weeks   Status New     PT LONG TERM GOAL #4   Title Patient will have left knee flexion within 10 degrees of right knee for return to sports such as soccer   Time 8   Period Weeks   Status New     PT LONG TERM GOAL #5   Title Quad and hamstring strength improved to 5/5 MMT for improved stability when walking and returning to active lifestyle   Time 8   Period Weeks   Status New               Plan - 06/01/16 1308    Clinical Impression Statement Patient presents with bil axillary crutches but demonstrates able to ambulate without crutches throughout session, pt educated in weaning off crutches as able.  Able to progress exercises and  perform full weight bearing, squats, and hamstring curls all in small 0-40 degree ROM.  Neuro re-ed with single leg standing and squats cueing to perform with equal weight bearing.  Pt will need skilled PT for progression of strength and ROM.   Rehab Potential Excellent   PT Frequency 2x / week   PT Duration 8 weeks   PT Treatment/Interventions ADLs/Self Care Home Management;Biofeedback;Cryotherapy;Lawyer;Therapeutic activities;Therapeutic exercise;Neuromuscular re-education;Balance training;Patient/family education;Manual techniques;Scar mobilization;Passive range of motion;Dry needling;Taping;Vasopneumatic Device   PT Next Visit Plan Continue to strengthen within protocol   Consulted and Agree with Plan of Care Patient      Patient will benefit from skilled therapeutic intervention in order to improve the following deficits and impairments:  Abnormal gait, Decreased activity tolerance, Decreased endurance, Decreased mobility, Decreased range of motion, Decreased strength, Difficulty walking, Increased edema, Impaired flexibility, Pain  Visit Diagnosis: Stiffness of left knee, not elsewhere classified  Acute pain of left knee  Difficulty in walking, not elsewhere classified  Muscle weakness (generalized)     Problem List Patient Active Problem List   Diagnosis Date Noted  . Sprain of anterior cruciate ligament of left knee 01/25/2016  . Left knee pain 12/17/2015  . Left knee injury 12/17/2015    Tyler Lozano, PT 06/01/2016, 8:45 AM  [facility] [address], [address] [place], [place], [address] Phone: [phone]   Fax:  [phone]  Name: Tyler Lozano MRN: [id number] Date of Birth: 29-Sep-1990

## 2016-06-01 NOTE — Patient Instructions (Signed)
MOTION: Single Leg Balance: Contralateral Shoulder Abduction    You don't have to do the arm movement... Stand on left leg hold for 5 building up to 10 seconds. _10__ sets _1__ times per day.  http://ggbe.exer.us/68   Copyright  VHI. All rights reserved.  (Clinic) Stabilization: Retraction Lift - Squat    You won't be using pulley and only do small range of motion 0-40 degrees squat. Repeat __10__ times per set. Do __2__ sets per session. Do _5-7___ sessions per week.  Copyright  VHI. All rights reserved.

## 2016-06-06 ENCOUNTER — Encounter: Payer: Self-pay | Admitting: Physical Therapy

## 2016-06-06 ENCOUNTER — Ambulatory Visit: Payer: Managed Care, Other (non HMO) | Admitting: Physical Therapy

## 2016-06-06 DIAGNOSIS — M6281 Muscle weakness (generalized): Secondary | ICD-10-CM

## 2016-06-06 DIAGNOSIS — M25662 Stiffness of left knee, not elsewhere classified: Secondary | ICD-10-CM | POA: Diagnosis not present

## 2016-06-06 DIAGNOSIS — R262 Difficulty in walking, not elsewhere classified: Secondary | ICD-10-CM

## 2016-06-06 DIAGNOSIS — M25562 Pain in left knee: Secondary | ICD-10-CM

## 2016-06-06 NOTE — Therapy (Signed)
[facility] [address], [address] [place], [place], [address] Phone: [phone]   Fax:  [phone]  Physical Therapy Treatment  Patient Details  Name: Tyler Lozano MRN: [id number] Date of Birth: 05/26/1990 Referring Provider: Cammy CopaScott Gregory Dean MD  Encounter Date: 06/06/2016      PT End of Session - 06/06/16 1153    Visit Number 4   Date for PT Re-Evaluation 07/20/16   PT Start Time 1145   PT Stop Time 1236   PT Time Calculation (min) 51 min   Activity Tolerance Patient tolerated treatment well   Behavior During Therapy [facility] for tasks assessed/performed      Past Medical History:  Diagnosis Date  . Anterior cruciate ligament complete tear    left    Past Surgical History:  Procedure Laterality Date  . ANTERIOR CRUCIATE LIGAMENT REPAIR Left 05/16/2016   Procedure: RECONSTRUCTION ANTERIOR CRUCIATE LIGAMENT (ACL);  Surgeon: Cammy CopaScott Gregory Dean, MD;  [facility];  Service: Orthopedics;  Laterality: Left;  . WISDOM TOOTH EXTRACTION      There were no vitals filed for this visit.      Subjective Assessment - 06/06/16 1151    Subjective Not really any pain, mostly just weakness and tight. Increase pain along from of shin when bending knee to put on socks   Patient is accompained by: Family member   Limitations Standing;Walking;Other (comment)   Patient Stated Goals return to regular activity, walking without crutches, and sports   Currently in Pain? No/denies   Pain Score 0-No pain                         [facility] Adult PT Treatment/Exercise - 06/06/16 0001      Knee/Hip Exercises: Aerobic   Stationary Bike 7 min for ROM  PT present to discuss treatment     Knee/Hip Exercises: Standing   Functional Squat 15 reps  0-40 degress ROM   SLS weight shift to full weight bearing left leg holding 5 sec x 15; hamstring curl 0-40 deg 15x left leg only     Knee/Hip Exercises: Supine   Short Arc Quad  Sets Strengthening;Left;20 reps  5 sec hold   Straight Leg Raises Strengthening;Left;1 set;10 reps     Modalities   Modalities Vasopneumatic     Vasopneumatic   Number Minutes Vasopneumatic  15 minutes   Vasopnuematic Location  Knee   Vasopneumatic Pressure Medium   Vasopneumatic Temperature  3 snow flakes                  PT Short Term Goals - 06/06/16 1156      PT SHORT TERM GOAL #2   Title Able to ambulate with good heel strike without crutches   Baseline no crutches, no heel strike   Time 4   Period Weeks   Status On-going     PT SHORT TERM GOAL #3   Title improved AROM from 0-100 degrees for improved functional mobility such as going up and down stairs   Baseline 100 deg AAROM   Time 4   Period Weeks   Status On-going           PT Long Term Goals - 06/06/16 1157      PT LONG TERM GOAL #1   Title Pt will be independent with a HEP and safe gym exercises and use of equipment.   Time 8   Period Weeks   Status On-going  PT LONG TERM GOAL #2   Title Pt will be able to descend the stairs with no pain using no hand rails with step over step pattern.   Time 8   Period Weeks   Status On-going     PT LONG TERM GOAL #3   Title Pt will improve his FOTO from 70% limiation to </= 39% limitation.    Time 8   Period Weeks   Status On-going     PT LONG TERM GOAL #4   Title Patient will have left knee flexion within 10 degrees of right knee for return to sports such as soccer   Time 8   Period Weeks   Status On-going     PT LONG TERM GOAL #5   Title Quad and hamstring strength improved to 5/5 MMT for improved stability when walking and returning to active lifestyle   Time 8   Period Weeks   Status On-going               Plan - 06/06/16 1355    Clinical Impression Statement Pt presents with no assistive device. Antalgic gait due to limited knee flexion with walking. Pt reports no pain just feeling very weak. Felt good after last session with  quad activation and bicycle ROM. Able to tolerate all exercises well today. Will continue to progress with LE strengthening and knee stability within protocol.    Rehab Potential Excellent   Clinical Impairments Affecting Rehab Potential none   PT Frequency 2x / week   PT Duration 8 weeks   PT Treatment/Interventions ADLs/Self Care Home Management;Biofeedback;Cryotherapy;Lawyer;Therapeutic activities;Therapeutic exercise;Neuromuscular re-education;Balance training;Patient/family education;Manual techniques;Scar mobilization;Passive range of motion;Dry needling;Taping;Vasopneumatic Device   PT Next Visit Plan Leg press, sinlge leg stance, strengthening within protocol   PT Home Exercise Plan progress as needed   Consulted and Agree with Plan of Care Patient      Patient will benefit from skilled therapeutic intervention in order to improve the following deficits and impairments:  Abnormal gait, Decreased activity tolerance, Decreased endurance, Decreased mobility, Decreased range of motion, Decreased strength, Difficulty walking, Increased edema, Impaired flexibility, Pain  Visit Diagnosis: Stiffness of left knee, not elsewhere classified  Acute pain of left knee  Difficulty in walking, not elsewhere classified  Muscle weakness (generalized)     Problem List Patient Active Problem List   Diagnosis Date Noted  . Sprain of anterior cruciate ligament of left knee 01/25/2016  . Left knee pain 12/17/2015  . Left knee injury 12/17/2015    Dessa Phi PTA 06/06/2016, 1:58 PM  [facility] [address] [place], [place], [address] Phone: [phone]   Fax:  [phone]  Name: Tyler Lozano MRN: [id number] Date of Birth: [birth year]

## 2016-06-06 NOTE — Therapy (Incomplete Revision)
[facility] [address], [address] [place], [place], [address] Phone: [phone]   Fax:  [phone]  Physical Therapy Treatment  Patient Details  Name: Brach Birdsall MRN: [id number] Date of Birth: 1990-07-17 Referring Provider: Cammy Copa MD  Encounter Date: 06/06/2016      PT End of Session - 06/06/16 1153    Visit Number 4   Date for PT Re-Evaluation 07/20/16   PT Start Time 1145   PT Stop Time 1236   PT Time Calculation (min) 51 min   Activity Tolerance Patient tolerated treatment well   Behavior During Therapy [facility] for tasks assessed/performed      Past Medical History:  Diagnosis Date  . Anterior cruciate ligament complete tear    left    Past Surgical History:  Procedure Laterality Date  . ANTERIOR CRUCIATE LIGAMENT REPAIR Left 05/16/2016   Procedure: RECONSTRUCTION ANTERIOR CRUCIATE LIGAMENT (ACL);  Surgeon: Cammy Copa, MD;  Location: [facility] OR;  Service: Orthopedics;  Laterality: Left;  . WISDOM TOOTH EXTRACTION      There were no vitals filed for this visit.      Subjective Assessment - 06/06/16 1151    Subjective Not really any pain, mostly just weakness and tight. Increase pain along from of shin when bending knee to put on socks   Patient is accompained by: Family member   Limitations Standing;Walking;Other (comment)   Patient Stated Goals return to regular activity, walking without crutches, and sports   Currently in Pain? No/denies   Pain Score 0-No pain                                   PT Short Term Goals - 06/06/16 1156      PT SHORT TERM GOAL #2   Title Able to ambulate with good heel strike without crutches   Baseline no crutches, no heel strike   Time 4   Period Weeks   Status On-going     PT SHORT TERM GOAL #3   Title improved AROM from 0-100 degrees for improved functional mobility such as going up and down stairs   Baseline 100 deg AAROM   Time 4   Period Weeks   Status On-going           PT Long Term Goals - 06/06/16 1157      PT LONG TERM GOAL #1   Title Pt will be independent with a HEP and safe gym exercises and use of equipment.   Time 8   Period Weeks   Status On-going     PT LONG TERM GOAL #2   Title Pt will be able to descend the stairs with no pain using no hand rails with step over step pattern.   Time 8   Period Weeks   Status On-going     PT LONG TERM GOAL #3   Title Pt will improve his FOTO from 70% limiation to </= 39% limitation.    Time 8   Period Weeks   Status On-going     PT LONG TERM GOAL #4   Title Patient will have left knee flexion within 10 degrees of right knee for return to sports such as soccer   Time 8   Period Weeks   Status On-going     PT LONG TERM GOAL #5   Title Quad and hamstring strength improved to 5/5 MMT for improved  stability when walking and returning to active lifestyle   Time 8   Period Weeks   Status On-going               Plan - 06/06/16 1355    Clinical Impression Statement Pt presents with no assistive device. Antalgic gait due to limited knee flexion with walking. Pt reports no pain just feeling very weak. Felt good after last session with quad activation and bicycle ROM. Able to tolerate all exercises well today. Will continue to progress with LE strengthening and knee stability within protocol.    Rehab Potential Excellent   Clinical Impairments Affecting Rehab Potential none   PT Frequency 2x / week   PT Duration 8 weeks   PT Treatment/Interventions ADLs/Self Care Home Management;Biofeedback;Cryotherapy;Lawyerlectrical Stimulation;Moist Heat;Ultrasound;Gait training;Therapeutic activities;Therapeutic exercise;Neuromuscular re-education;Balance training;Patient/family education;Manual techniques;Scar mobilization;Passive range of motion;Dry needling;Taping;Vasopneumatic Device   PT Next Visit Plan Leg press, sinlge leg stance, strengthening within  protocol   PT Home Exercise Plan progress as needed   Consulted and Agree with Plan of Care Patient      Patient will benefit from skilled therapeutic intervention in order to improve the following deficits and impairments:  Abnormal gait, Decreased activity tolerance, Decreased endurance, Decreased mobility, Decreased range of motion, Decreased strength, Difficulty walking, Increased edema, Impaired flexibility, Pain  Visit Diagnosis: Stiffness of left knee, not elsewhere classified  Acute pain of left knee  Difficulty in walking, not elsewhere classified  Muscle weakness (generalized)     Problem List Patient Active Problem List   Diagnosis Date Noted  . Sprain of anterior cruciate ligament of left knee 01/25/2016  . Left knee pain 12/17/2015  . Left knee injury 12/17/2015    Dessa PhiKatherine Danijah Noh PTA 06/07/2016, 10:40 AM  [facility] [address], [address] [place], [place], [address] Phone: [phone]   Fax:  [phone]  Name: Wyn QuakerJoseph Labra MRN: [id number] Date of Birth: 01/24/1991

## 2016-06-08 ENCOUNTER — Ambulatory Visit: Payer: Managed Care, Other (non HMO) | Admitting: Physical Therapy

## 2016-06-08 ENCOUNTER — Encounter: Payer: Self-pay | Admitting: Physical Therapy

## 2016-06-08 DIAGNOSIS — R262 Difficulty in walking, not elsewhere classified: Secondary | ICD-10-CM

## 2016-06-08 DIAGNOSIS — M6281 Muscle weakness (generalized): Secondary | ICD-10-CM

## 2016-06-08 DIAGNOSIS — M25662 Stiffness of left knee, not elsewhere classified: Secondary | ICD-10-CM

## 2016-06-08 DIAGNOSIS — M25562 Pain in left knee: Secondary | ICD-10-CM

## 2016-06-08 NOTE — Therapy (Signed)
[facility] [address] [place], [place], [address] Phone: [phone]   Fax:  [phone]  Physical Therapy Treatment  Patient Details  Name: Tyler Lozano MRN: [id number] Date of Birth: April 07, 1990 Referring Provider: Cammy Copa MD  Encounter Date: 06/08/2016      PT End of Session - 06/08/16 0753    Visit Number 5   Date for PT Re-Evaluation 07/20/16   PT Start Time 0756   PT Stop Time 0850   PT Time Calculation (min) 54 min   Activity Tolerance Patient tolerated treatment well   Behavior During Therapy [facility] for tasks assessed/performed      Past Medical History:  Diagnosis Date  . Anterior cruciate ligament complete tear    left    Past Surgical History:  Procedure Laterality Date  . ANTERIOR CRUCIATE LIGAMENT REPAIR Left 05/16/2016   Procedure: RECONSTRUCTION ANTERIOR CRUCIATE LIGAMENT (ACL);  Surgeon: Cammy Copa, MD;  Location: [facility] OR;  Service: Orthopedics;  Laterality: Left;  . WISDOM TOOTH EXTRACTION      There were no vitals filed for this visit.      Subjective Assessment - 06/08/16 0759    Subjective Stairs are still hard but overall good. A little tighter this morning.     Limitations Standing;Walking;Other (comment)   Patient Stated Goals return to regular activity, walking without crutches, and sports   Currently in Pain? No/denies            [facility] PT Assessment - 06/08/16 0001      AROM   Left Knee Flexion 120                     [facility] Adult PT Treatment/Exercise - 06/08/16 0001      Knee/Hip Exercises: Aerobic   Stationary Bike 7 min for ROM  PT present to discuss treatment     Knee/Hip Exercises: Standing   Hip Flexion Stengthening;Both;10 reps  red band   Hip ADduction Strengthening;Both;10 reps  red band   Hip Abduction Stengthening;Both;10 reps  red band   Hip Extension Stengthening;Both;10 reps  red band   Functional Squat 15 reps   0-40 degress ROM   SLS SLS on foam x 2 min     Knee/Hip Exercises: Supine   Short Arc Quad Sets Strengthening;Left;20 reps  5 sec hold; 2lb   Straight Leg Raises Strengthening;Left;1 set;20 reps     Knee/Hip Exercises: Sidelying   Hip ABduction Strengthening;Left;1 set;20 reps  2lb   Hip ADduction Strengthening;Left;1 set;20 reps  2lb     Vasopneumatic   Number Minutes Vasopneumatic  15 minutes   Vasopnuematic Location  Knee   Vasopneumatic Pressure Medium   Vasopneumatic Temperature  3 snow flakes                  PT Short Term Goals - 06/06/16 1156      PT SHORT TERM GOAL #2   Title Able to ambulate with good heel strike without crutches   Baseline no crutches, no heel strike   Time 4   Period Weeks   Status On-going     PT SHORT TERM GOAL #3   Title improved AROM from 0-100 degrees for improved functional mobility such as going up and down stairs   Baseline 100 deg AAROM   Time 4   Period Weeks   Status On-going           PT Long Term Goals - 06/06/16  1157      PT LONG TERM GOAL #1   Title Pt will be independent with a HEP and safe gym exercises and use of equipment.   Time 8   Period Weeks   Status On-going     PT LONG TERM GOAL #2   Title Pt will be able to descend the stairs with no pain using no hand rails with step over step pattern.   Time 8   Period Weeks   Status On-going     PT LONG TERM GOAL #3   Title Pt will improve his FOTO from 70% limiation to </= 39% limitation.    Time 8   Period Weeks   Status On-going     PT LONG TERM GOAL #4   Title Patient will have left knee flexion within 10 degrees of right knee for return to sports such as soccer   Time 8   Period Weeks   Status On-going     PT LONG TERM GOAL #5   Title Quad and hamstring strength improved to 5/5 MMT for improved stability when walking and returning to active lifestyle   Time 8   Period Weeks   Status On-going               Plan - 06/08/16 [id number]     Clinical Impression Statement Patient did well with addition of resistance and increased single leg standing.  Added foam for increased proprioception and patient tolerated all exercises well.  Pt knee flexion increased to 120 degrees. Pt is progressing very well.  Continues to have swelling and weakness and skilled PT needed to address deficits so pt will return to functional activities.    Rehab Potential Excellent   PT Treatment/Interventions ADLs/Self Care Home Management;Biofeedback;Cryotherapy;Lawyerlectrical Stimulation;Moist Heat;Ultrasound;Gait training;Therapeutic activities;Therapeutic exercise;Neuromuscular re-education;Balance training;Patient/family education;Manual techniques;Scar mobilization;Passive range of motion;Dry needling;Taping;Vasopneumatic Device   PT Next Visit Plan Leg press, sinlge leg stance, strengthening within protocol   Consulted and Agree with Plan of Care Patient      Patient will benefit from skilled therapeutic intervention in order to improve the following deficits and impairments:  Abnormal gait, Decreased activity tolerance, Decreased endurance, Decreased mobility, Decreased range of motion, Decreased strength, Difficulty walking, Increased edema, Impaired flexibility, Pain  Visit Diagnosis: Stiffness of left knee, not elsewhere classified  Acute pain of left knee  Difficulty in walking, not elsewhere classified  Muscle weakness (generalized)     Problem List Patient Active Problem List   Diagnosis Date Noted  . Sprain of anterior cruciate ligament of left knee 01/25/2016  . Left knee pain 12/17/2015  . Left knee injury 12/17/2015    Tyler Lozano, PT 06/08/2016, 8:40 AM  [facility] [address], [address] [place], [place], [address] Phone: [phone]   Fax:  [phone]  Name: Tyler Lozano MRN: [id number] Date of Birth: 12/23/1990

## 2016-06-13 ENCOUNTER — Encounter: Payer: Self-pay | Admitting: Physical Therapy

## 2016-06-13 ENCOUNTER — Ambulatory Visit: Payer: Managed Care, Other (non HMO) | Admitting: Physical Therapy

## 2016-06-13 DIAGNOSIS — M25562 Pain in left knee: Secondary | ICD-10-CM

## 2016-06-13 DIAGNOSIS — M25662 Stiffness of left knee, not elsewhere classified: Secondary | ICD-10-CM

## 2016-06-13 DIAGNOSIS — M6281 Muscle weakness (generalized): Secondary | ICD-10-CM

## 2016-06-13 DIAGNOSIS — R262 Difficulty in walking, not elsewhere classified: Secondary | ICD-10-CM

## 2016-06-13 NOTE — Therapy (Signed)
[facility] [address], [address] [place], [place], [address] Phone: [phone]   Fax:  [phone]  Physical Therapy Treatment  Patient Details  Name: Tyler QuakerJoseph Lozano MRN: [id number] Date of Birth: 07/13/1990 Referring Provider: Cammy CopaScott Gregory Dean MD  Encounter Date: 06/13/2016      PT End of Session - 06/13/16 0803    Visit Number 6   Date for PT Re-Evaluation 07/20/16   PT Start Time 0758   PT Stop Time 0853   PT Time Calculation (min) 55 min   Activity Tolerance Patient tolerated treatment well   Behavior During Therapy [facility] for tasks assessed/performed      Past Medical History:  Diagnosis Date  . Anterior cruciate ligament complete tear    left    Past Surgical History:  Procedure Laterality Date  . ANTERIOR CRUCIATE LIGAMENT REPAIR Left 05/16/2016   Procedure: RECONSTRUCTION ANTERIOR CRUCIATE LIGAMENT (ACL);  Surgeon: Cammy CopaScott Gregory Dean, MD;  [facility];  Service: Orthopedics;  Laterality: Left;  . WISDOM TOOTH EXTRACTION      There were no vitals filed for this visit.      Subjective Assessment - 06/13/16 0802    Subjective Pt has nothing new to reports. No pain in knee.    Patient is accompained by: Family member   Limitations Standing;Walking;Other (comment)   Patient Stated Goals return to regular activity, walking without crutches, and sports   Currently in Pain? No/denies   Pain Score 0-No pain                         [facility] Adult PT Treatment/Exercise - 06/13/16 0001      Knee/Hip Exercises: Aerobic   Stationary Bike 7 min for ROM  PT present to discuss treatment     Knee/Hip Exercises: Machines for Strengthening   Cybex Leg Press #55 Bil #40 Lt only  Seat 6     Knee/Hip Exercises: Standing   Knee Flexion --   Hip Flexion Stengthening;Both;10 reps  red band   Hip ADduction Strengthening;Both;10 reps  red band   Hip Abduction Stengthening;Both;10 reps  red band   Hip Extension Stengthening;Both;10 reps  red band   Lateral Step Up Left;3 sets;10 reps;Step Height: 4"  Heel taps   Functional Squat 15 reps  0-40 degress ROM   SLS SLS on foam x 2 min  With ball toss   Gait Training Useing mirror for feedback   Other Standing Knee Exercises Ladder walking  forward, sideways     Modalities   Modalities Vasopneumatic     Vasopneumatic   Number Minutes Vasopneumatic  15 minutes   Vasopnuematic Location  Knee   Vasopneumatic Pressure Medium   Vasopneumatic Temperature  3 snow flakes                  PT Short Term Goals - 06/13/16 0803      PT SHORT TERM GOAL #2   Title Able to ambulate with good heel strike without crutches   Baseline no crutches, no heel strike   Time 4   Period Weeks   Status On-going     PT SHORT TERM GOAL #3   Title improved AROM from 0-100 degrees for improved functional mobility such as going up and down stairs   Time 4   Period Weeks   Status On-going           PT Long Term Goals - 06/13/16 [id number]  PT LONG TERM GOAL #1   Title Pt will be independent with a HEP and safe gym exercises and use of equipment.   Time 8   Period Weeks   Status On-going               Plan - 06/13/16 1610    Clinical Impression Statement Pt presents with no crutches and no brace but with antalgic gait on Lt LE. Pt practiced proper gait pattern using mirror for biofeed back to bent Lt knee and swing through during gait. Continues to have slightly antalgic gait due to limited ROM. Pt able to tolerate all exercises well with good balance. Pt continues to have difficulty with ascending and decending stairs due to weakness. Pt will continue to benefit from skilled therapy for LE strength and stability.    Rehab Potential Excellent   Clinical Impairments Affecting Rehab Potential none   PT Frequency 2x / week   PT Duration 8 weeks   PT Treatment/Interventions ADLs/Self Care Home  Management;Biofeedback;Cryotherapy;Lawyer;Therapeutic activities;Therapeutic exercise;Neuromuscular re-education;Balance training;Patient/family education;Manual techniques;Scar mobilization;Passive range of motion;Dry needling;Taping;Vasopneumatic Device   PT Next Visit Plan Leg press, sinlge leg stance, strengthening within protocol   PT Home Exercise Plan progress as needed   Consulted and Agree with Plan of Care Patient      Patient will benefit from skilled therapeutic intervention in order to improve the following deficits and impairments:  Abnormal gait, Decreased activity tolerance, Decreased endurance, Decreased mobility, Decreased range of motion, Decreased strength, Difficulty walking, Increased edema, Impaired flexibility, Pain  Visit Diagnosis: Stiffness of left knee, not elsewhere classified  Acute pain of left knee  Difficulty in walking, not elsewhere classified  Muscle weakness (generalized)     Problem List Patient Active Problem List   Diagnosis Date Noted  . Sprain of anterior cruciate ligament of left knee 01/25/2016  . Left knee pain 12/17/2015  . Left knee injury 12/17/2015    Dessa Phi PTA 06/13/2016, 9:02 AM  [facility] [address], [address] [place], [place], [address] Phone: [phone]   Fax:  [phone]  Name: Tyler Lozano MRN: [id number] Date of Birth: [birth year]

## 2016-06-15 ENCOUNTER — Encounter (INDEPENDENT_AMBULATORY_CARE_PROVIDER_SITE_OTHER): Payer: Self-pay | Admitting: Orthopedic Surgery

## 2016-06-15 ENCOUNTER — Ambulatory Visit: Payer: Managed Care, Other (non HMO) | Admitting: Physical Therapy

## 2016-06-15 ENCOUNTER — Ambulatory Visit (INDEPENDENT_AMBULATORY_CARE_PROVIDER_SITE_OTHER): Payer: Self-pay | Admitting: Orthopedic Surgery

## 2016-06-15 ENCOUNTER — Encounter: Payer: Self-pay | Admitting: Physical Therapy

## 2016-06-15 DIAGNOSIS — R262 Difficulty in walking, not elsewhere classified: Secondary | ICD-10-CM

## 2016-06-15 DIAGNOSIS — M25662 Stiffness of left knee, not elsewhere classified: Secondary | ICD-10-CM | POA: Diagnosis not present

## 2016-06-15 DIAGNOSIS — S83512D Sprain of anterior cruciate ligament of left knee, subsequent encounter: Secondary | ICD-10-CM

## 2016-06-15 DIAGNOSIS — M6281 Muscle weakness (generalized): Secondary | ICD-10-CM

## 2016-06-15 DIAGNOSIS — M25562 Pain in left knee: Secondary | ICD-10-CM

## 2016-06-15 NOTE — Progress Notes (Signed)
   Post-Op Visit Note   Patient: Tyler Lozano           Date of Birth: 11/10/1990           MRN: [id number] Visit Date: 06/15/2016 PCP: Ernst BreachYSINGER, DAVID SHANE, PA-C   Assessment & Plan:  Chief Complaint:  Chief Complaint  Patient presents with  . Left Knee - Routine Post Op   Visit Diagnoses:  1. Sprain of anterior cruciate ligament of left knee, subsequent encounter     Plan: Tyler QuakerJoseph Ege is a 26 year old patient whose now a month out left knee anterior cruciate ligament reconstruction.  He is walking well.  Denies having any pain.  He's been doing therapy bike leg press clots and balance work.  On exam he is walking with a normal gait.  Slight atrophy is present trace effusion present but range of motion is excellent graft is stable.  Plan is two-month return continue physical therapy with no cutting or pivoting until I see him back.  He is making good progress.  Follow-Up Instructions: Return in about 8 weeks (around 08/10/2016).   Orders:  No orders of the defined types were placed in this encounter.  No orders of the defined types were placed in this encounter.   Imaging: No results found.  PMFS History: Patient Active Problem List   Diagnosis Date Noted  . Sprain of anterior cruciate ligament of left knee 01/25/2016  . Left knee pain 12/17/2015  . Left knee injury 12/17/2015   Past Medical History:  Diagnosis Date  . Anterior cruciate ligament complete tear    left    No family history on file.  Past Surgical History:  Procedure Laterality Date  . ANTERIOR CRUCIATE LIGAMENT REPAIR Left 05/16/2016   Procedure: RECONSTRUCTION ANTERIOR CRUCIATE LIGAMENT (ACL);  Surgeon: Cammy CopaScott Arnetra Terris, MD;  [facility];  Service: Orthopedics;  Laterality: Left;  . WISDOM TOOTH EXTRACTION     Social History   Occupational History  . Not on file.   Social History Main Topics  . Smoking status: Never Smoker  . Smokeless tobacco: Never Used  . Alcohol use 3.0 oz/week      5 Shots of liquor per week     Comment: weekend drinker -  x5 drinks   . Drug use: No  . Sexual activity: Not on file

## 2016-06-15 NOTE — Therapy (Signed)
[facility] [address], [address] [place], [place], [address] Phone: [phone]   Fax:  [phone]  Physical Therapy Treatment  Patient Details  Name: Tyler Lozano MRN: [id number] Date of Birth: 07-14-1990 Referring Provider: Cammy Copa MD  Encounter Date: 06/15/2016      PT End of Session - 06/15/16 0802    Visit Number 7   Date for PT Re-Evaluation 07/20/16   PT Start Time 0800   PT Stop Time 0838   PT Time Calculation (min) 38 min   Activity Tolerance Patient tolerated treatment well   Behavior During Therapy [facility] for tasks assessed/performed      Past Medical History:  Diagnosis Date  . Anterior cruciate ligament complete tear    left    Past Surgical History:  Procedure Laterality Date  . ANTERIOR CRUCIATE LIGAMENT REPAIR Left 05/16/2016   Procedure: RECONSTRUCTION ANTERIOR CRUCIATE LIGAMENT (ACL);  Surgeon: Cammy Copa, MD;  [facility];  Service: Orthopedics;  Laterality: Left;  . WISDOM TOOTH EXTRACTION      There were no vitals filed for this visit.      Subjective Assessment - 06/15/16 0801    Subjective Pt has been working on walking properly.    Patient is accompained by: Family member   Limitations Standing;Walking;Other (comment)   Patient Stated Goals return to regular activity, walking without crutches, and sports   Currently in Pain? No/denies   Pain Score 0-No pain            [facility] PT Assessment - 06/15/16 0001      Assessment   Medical Diagnosis S83.521D sprain of left ACL left knee   Referring Provider Cammy Copa MD   Onset Date/Surgical Date 05/16/16     Precautions   Precautions Knee     Restrictions   Weight Bearing Restrictions Yes     Balance Screen   Has the patient fallen in the past 6 months No     Home Environment   Living Environment Private residence   Available Help at Discharge Family     Prior Function   Level of Independence  Independent   Vocation Full time employment     Posture/Postural Control   Posture/Postural Control No significant limitations     ROM / Strength   AROM / PROM / Strength --     AROM   AROM Assessment Site Knee   Right/Left Knee Left   Left Knee Extension 5   Left Knee Flexion 125     Strength   Right Hip Flexion 5/5   Right Hip ABduction 5/5   Left Hip Flexion 4+/5   Left Hip ABduction 4+/5   Right/Left Knee Right;Left   Right Knee Flexion 5/5   Right Knee Extension 5/5   Left Knee Flexion 4-/5   Left Knee Extension 3/5     Palpation   Patella mobility moves well in all planes, swelling limiting mobility                     [facility] Adult PT Treatment/Exercise - 06/15/16 0001      Knee/Hip Exercises: Stretches   Gastroc Stretch Both;2 reps;10 seconds     Knee/Hip Exercises: Aerobic   Stationary Bike L3 x 8 min for ROM  PT present to discuss treatment     Knee/Hip Exercises: Machines for Strengthening   Cybex Leg Press #75 Bil #55 Lt only  Seat 6  Knee/Hip Exercises: Standing   Hip Flexion Stengthening;Both;10 reps  red band   Hip ADduction Strengthening;Both;10 reps  red band   Hip Abduction Stengthening;Both;10 reps  red band   Hip Extension Stengthening;Both;10 reps  red band   Lateral Step Up Left;3 sets;10 reps;Step Height: 4"  Heel taps   Functional Squat 15 reps  With mirror feed back   Rocker Board 2 minutes  Balance 2 ways   SLS SLS on foam x 2 min  With ball toss     Knee/Hip Exercises: Supine   Straight Leg Raises Strengthening;Left;1 set;20 reps  #2                  PT Short Term Goals - 06/15/16 0843      PT SHORT TERM GOAL #1   Title pt will be indpendent in his HEP   Status Achieved     PT SHORT TERM GOAL #2   Title Able to ambulate with good heel strike without crutches   Status Achieved     PT SHORT TERM GOAL #3   Title improved AROM from 0-100 degrees for improved functional mobility such as going  up and down stairs   Status On-going           PT Long Term Goals - 06/13/16 0804      PT LONG TERM GOAL #1   Title Pt will be independent with a HEP and safe gym exercises and use of equipment.   Time 8   Period Weeks   Status On-going               Plan - 06/15/16 [id number]    Clinical Impression Statement Pt presents with only slightly antalgic gait due to limited knee ROM. Pt did well with all strengthening and balance exercises. Pt able to keep more even weight distribution during squats. Pt has improved Lt LE strength and continues to improve with Lt knee AROM. Pt will continue to benefit from skilled therapy for Lt LE strength and stability to return to daily activities with no difficulty or pain.    Rehab Potential Excellent   Clinical Impairments Affecting Rehab Potential none   PT Frequency 2x / week   PT Duration 8 weeks   PT Treatment/Interventions ADLs/Self Care Home Management;Biofeedback;Cryotherapy;Lawyerlectrical Stimulation;Moist Heat;Ultrasound;Gait training;Therapeutic activities;Therapeutic exercise;Neuromuscular re-education;Balance training;Patient/family education;Manual techniques;Scar mobilization;Passive range of motion;Dry needling;Taping;Vasopneumatic Device   PT Next Visit Plan Stairs, quad and hamstring strengthening   Consulted and Agree with Plan of Care Patient      Patient will benefit from skilled therapeutic intervention in order to improve the following deficits and impairments:  Abnormal gait, Decreased activity tolerance, Decreased endurance, Decreased mobility, Decreased range of motion, Decreased strength, Difficulty walking, Increased edema, Impaired flexibility, Pain  Visit Diagnosis: Stiffness of left knee, not elsewhere classified  Acute pain of left knee  Difficulty in walking, not elsewhere classified  Muscle weakness (generalized)     Problem List Patient Active Problem List   Diagnosis Date Noted  . Sprain of anterior  cruciate ligament of left knee 01/25/2016  . Left knee pain 12/17/2015  . Left knee injury 12/17/2015    Dessa PhiKatherine Sydna Brodowski PTA 06/15/2016, 8:49 AM  [facility] [address], [address] [place], [place], [address] Phone: [phone]   Fax:  [phone]  Name: Tyler Lozano MRN: [id number] Date of Birth: 04/30/1990

## 2016-06-20 ENCOUNTER — Encounter: Payer: Self-pay | Admitting: Physical Therapy

## 2016-06-20 ENCOUNTER — Ambulatory Visit: Payer: Managed Care, Other (non HMO) | Admitting: Physical Therapy

## 2016-06-20 DIAGNOSIS — M6281 Muscle weakness (generalized): Secondary | ICD-10-CM

## 2016-06-20 DIAGNOSIS — M25662 Stiffness of left knee, not elsewhere classified: Secondary | ICD-10-CM

## 2016-06-20 DIAGNOSIS — R262 Difficulty in walking, not elsewhere classified: Secondary | ICD-10-CM

## 2016-06-20 DIAGNOSIS — M25562 Pain in left knee: Secondary | ICD-10-CM

## 2016-06-20 NOTE — Therapy (Signed)
[facility] [address], [address] [place], [place], [address] Phone: [phone]   Fax:  [phone]  Physical Therapy Treatment  Patient Details  Name: Tyler QuakerJoseph Beeghly MRN: [id number] Date of Birth: 05/07/1990 Referring Provider: Cammy CopaScott Gregory Dean MD  Encounter Date: 06/20/2016      PT End of Session - 06/20/16 0801    Visit Number 8   Date for PT Re-Evaluation 07/20/16   PT Start Time 0758   PT Stop Time 0844   PT Time Calculation (min) 46 min   Activity Tolerance Patient tolerated treatment well   Behavior During Therapy [facility] for tasks assessed/performed      Past Medical History:  Diagnosis Date  . Anterior cruciate ligament complete tear    left    Past Surgical History:  Procedure Laterality Date  . ANTERIOR CRUCIATE LIGAMENT REPAIR Left 05/16/2016   Procedure: RECONSTRUCTION ANTERIOR CRUCIATE LIGAMENT (ACL);  Surgeon: Cammy CopaScott Gregory Dean, MD;  [facility];  Service: Orthopedics;  Laterality: Left;  . WISDOM TOOTH EXTRACTION      There were no vitals filed for this visit.      Subjective Assessment - 06/20/16 0800    Subjective Pt has been trying to take stairs more often. Took 5 flights of stairs down other day and stated "at first I thougt it was a mistake becuase it was tough but towards the end it had losened up and was a lot easier."   Patient is accompained by: Family member   Limitations Standing;Walking;Other (comment)   Patient Stated Goals return to regular activity, walking without crutches, and sports   Currently in Pain? No/denies   Pain Score 0-No pain                         [facility] Adult PT Treatment/Exercise - 06/20/16 0001      Knee/Hip Exercises: Stretches   Active Hamstring Stretch Left;2 reps;10 seconds   Gastroc Stretch Both;2 reps;10 seconds     Knee/Hip Exercises: Aerobic   Stationary Bike L3 x 7 min for ROM  PT present to discuss treatment     Knee/Hip  Exercises: Machines for Strengthening   Cybex Leg Press #75 Bil #55 Lt only  Seat 6     Knee/Hip Exercises: Standing   Forward Lunges Both;2 sets;10 reps  half lunge   Lateral Step Up Left;3 sets;10 reps;Step Height: 6"  Heel taps   Forward Step Up Left;1 set;20 reps  BOSU   Rocker Board 2 minutes  Blanace on BOSU   Rebounder 4 directions x 8 each   #30     Manual Therapy   Manual Therapy Taping   Manual therapy comments Lt knee   Kinesiotex Edema                  PT Short Term Goals - 06/20/16 0801      PT SHORT TERM GOAL #3   Title improved AROM from 0-100 degrees for improved functional mobility such as going up and down stairs   Time 4   Period Weeks           PT Long Term Goals - 06/13/16 [id number]      PT LONG TERM GOAL #1   Title Pt will be independent with a HEP and safe gym exercises and use of equipment.   Time 8   Period Weeks   Status On-going  Plan - 06/20/16 0846    Clinical Impression Statement Pt walking with better gait pattern. Continues to have weakness in Lt quad and limited AROM due to weakness and swelling. Able to tolerate all exercises well. Edema taping to Lt knee for swelling and knee stability facilitation. Pt will continue to benefit from skilled therapy for LE strength and stability.    Rehab Potential Excellent   Clinical Impairments Affecting Rehab Potential none   PT Frequency 2x / week   PT Duration 8 weeks   PT Treatment/Interventions ADLs/Self Care Home Management;Biofeedback;Cryotherapy;Lawyer;Therapeutic activities;Therapeutic exercise;Neuromuscular re-education;Balance training;Patient/family education;Manual techniques;Scar mobilization;Passive range of motion;Dry needling;Taping;Vasopneumatic Device   PT Next Visit Plan Quad and hamstring strengthening   Consulted and Agree with Plan of Care Patient      Patient will benefit from skilled  therapeutic intervention in order to improve the following deficits and impairments:  Abnormal gait, Decreased activity tolerance, Decreased endurance, Decreased mobility, Decreased range of motion, Decreased strength, Difficulty walking, Increased edema, Impaired flexibility, Pain  Visit Diagnosis: Stiffness of left knee, not elsewhere classified  Acute pain of left knee  Difficulty in walking, not elsewhere classified  Muscle weakness (generalized)     Problem List Patient Active Problem List   Diagnosis Date Noted  . Sprain of anterior cruciate ligament of left knee 01/25/2016  . Left knee pain 12/17/2015  . Left knee injury 12/17/2015    Dessa Phi PTA 06/20/2016, 8:47 AM  [facility] [address], [address] [place], [place], [address] Phone: [phone]   Fax:  [phone]  Name: Hendrixx Severin MRN: [id number] Date of Birth: 08/08/1990

## 2016-06-21 ENCOUNTER — Encounter: Payer: Self-pay | Admitting: Physical Therapy

## 2016-06-21 ENCOUNTER — Ambulatory Visit: Payer: Managed Care, Other (non HMO) | Admitting: Physical Therapy

## 2016-06-21 DIAGNOSIS — M25662 Stiffness of left knee, not elsewhere classified: Secondary | ICD-10-CM | POA: Diagnosis not present

## 2016-06-21 DIAGNOSIS — R262 Difficulty in walking, not elsewhere classified: Secondary | ICD-10-CM

## 2016-06-21 DIAGNOSIS — M6281 Muscle weakness (generalized): Secondary | ICD-10-CM

## 2016-06-21 DIAGNOSIS — M25562 Pain in left knee: Secondary | ICD-10-CM

## 2016-06-21 NOTE — Therapy (Signed)
[facility] [address], [address] [place], [place], [address] Phone: [phone]   Fax:  [phone]  Physical Therapy Treatment  Patient Details  Name: Tyler Lozano MRN: [id number] Date of Birth: 10/29/1990 Referring Provider: Cammy CopaScott Gregory Dean MD  Encounter Date: 06/21/2016      PT End of Session - 06/21/16 0802    Visit Number 9   Date for PT Re-Evaluation 07/20/16   PT Start Time 0758   PT Stop Time 0844   PT Time Calculation (min) 46 min   Activity Tolerance Patient tolerated treatment well   Behavior During Therapy [facility] for tasks assessed/performed      Past Medical History:  Diagnosis Date  . Anterior cruciate ligament complete tear    left    Past Surgical History:  Procedure Laterality Date  . ANTERIOR CRUCIATE LIGAMENT REPAIR Left 05/16/2016   Procedure: RECONSTRUCTION ANTERIOR CRUCIATE LIGAMENT (ACL);  Surgeon: Cammy CopaScott Gregory Dean, MD;  [facility];  Service: Orthopedics;  Laterality: Left;  . WISDOM TOOTH EXTRACTION      There were no vitals filed for this visit.      Subjective Assessment - 06/21/16 0801    Subjective Feeling pretty good this AM.    Currently in Pain? No/denies   Multiple Pain Sites No                         [facility] Adult PT Treatment/Exercise - 06/21/16 0001      Knee/Hip Exercises: Stretches   Active Hamstring Stretch Left;2 reps;10 seconds     Knee/Hip Exercises: Aerobic   Stationary Bike L3 x 8 min  Review of status with Pt     Knee/Hip Exercises: Machines for Strengthening   Cybex Leg Press Seat 6: Bil 75# 10x, 90# 2x 10, LTLE 55# 10x, 60# 2x10     Knee/Hip Exercises: Standing   Knee Flexion --  Standing on box, 3# 3x10, VC to not use QL   Lateral Step Up Left;2 sets;15 reps;Hand Hold: 0;Step Height: 6"   Forward Step Up Left;2 sets;15 reps;Step Height: 6"   Wall Squat --  With adductor squeeze 5 sec hold 10x   SLS --  On Blue pod for static  balance then plyo toss 2x10                  PT Short Term Goals - 06/20/16 0801      PT SHORT TERM GOAL #3   Title improved AROM from 0-100 degrees for improved functional mobility such as going up and down stairs   Time 4   Period Weeks           PT Long Term Goals - 06/13/16 [id number]      PT LONG TERM GOAL #1   Title Pt will be independent with a HEP and safe gym exercises and use of equipment.   Time 8   Period Weeks   Status On-going               Plan - 06/21/16 0802    Clinical Impression Statement Pt tolerated increases in weight on the leg press today. Pt demonstrates weakness and instablity in single leg stance exercises and when hamstrings are isolated.    Rehab Potential Excellent   Clinical Impairments Affecting Rehab Potential none   PT Frequency 2x / week   PT Duration 8 weeks   PT Treatment/Interventions ADLs/Self Care Home Management;Biofeedback;Cryotherapy;Lawyerlectrical Stimulation;Moist Heat;Ultrasound;Gait training;Therapeutic activities;Therapeutic  exercise;Neuromuscular re-education;Balance training;Patient/family education;Manual techniques;Scar mobilization;Passive range of motion;Dry needling;Taping;Vasopneumatic Device   PT Next Visit Plan Pt appraoching 6 week post op, cont with single leg ex and closed chain strength   Consulted and Agree with Plan of Care --      Patient will benefit from skilled therapeutic intervention in order to improve the following deficits and impairments:  Abnormal gait, Decreased activity tolerance, Decreased endurance, Decreased mobility, Decreased range of motion, Decreased strength, Difficulty walking, Increased edema, Impaired flexibility, Pain  Visit Diagnosis: Stiffness of left knee, not elsewhere classified  Acute pain of left knee  Difficulty in walking, not elsewhere classified  Muscle weakness (generalized)     Problem List Patient Active Problem List   Diagnosis Date Noted  . Sprain of  anterior cruciate ligament of left knee 01/25/2016  . Left knee pain 12/17/2015  . Left knee injury 12/17/2015    Kimball Manske, PTA 06/21/2016, 8:44 AM  [facility] [address] [place], [place], [address] Phone: [phone]   Fax:  [phone]  Name: Tyler Lozano MRN: [id number] Date of Birth: 01/12/1991

## 2016-06-27 ENCOUNTER — Ambulatory Visit: Payer: Managed Care, Other (non HMO) | Attending: Orthopedic Surgery | Admitting: Physical Therapy

## 2016-06-27 ENCOUNTER — Encounter: Payer: Self-pay | Admitting: Physical Therapy

## 2016-06-27 DIAGNOSIS — M6281 Muscle weakness (generalized): Secondary | ICD-10-CM | POA: Diagnosis present

## 2016-06-27 DIAGNOSIS — M25662 Stiffness of left knee, not elsewhere classified: Secondary | ICD-10-CM | POA: Insufficient documentation

## 2016-06-27 DIAGNOSIS — M25562 Pain in left knee: Secondary | ICD-10-CM | POA: Insufficient documentation

## 2016-06-27 DIAGNOSIS — R262 Difficulty in walking, not elsewhere classified: Secondary | ICD-10-CM

## 2016-06-27 NOTE — Therapy (Signed)
[facility] [address] [place], [place], [address] Phone: [phone]   Fax:  [phone]  Physical Therapy Treatment  Patient Details  Name: Tyler Lozano MRN: [id number] Date of Birth: November 02, 1990 Referring Provider: Meredith Pel MD  Encounter Date: 06/27/2016      PT End of Session - 06/27/16 0802    Visit Number 10   Date for PT Re-Evaluation 07/20/16   PT Start Time 0759   PT Stop Time 0840   PT Time Calculation (min) 41 min   Activity Tolerance Patient tolerated treatment well   Behavior During Therapy [facility] for tasks assessed/performed      Past Medical History:  Diagnosis Date  . Anterior cruciate ligament complete tear    left    Past Surgical History:  Procedure Laterality Date  . ANTERIOR CRUCIATE LIGAMENT REPAIR Left 05/16/2016   Procedure: RECONSTRUCTION ANTERIOR CRUCIATE LIGAMENT (ACL);  Surgeon: Meredith Pel, MD;  Location: [place];  Service: Orthopedics;  Laterality: Left;  . WISDOM TOOTH EXTRACTION      There were no vitals filed for this visit.      Subjective Assessment - 06/27/16 0801    Subjective No knee pain   Patient is accompained by: Family member   Limitations Standing;Walking;Other (comment)   Patient Stated Goals return to regular activity, walking without crutches, and sports   Currently in Pain? No/denies   Pain Score 0-No pain                         [facility] Adult PT Treatment/Exercise - 06/27/16 0001      Knee/Hip Exercises: Stretches   Active Hamstring Stretch Left;2 reps;10 seconds   Gastroc Stretch Both;2 reps;10 seconds     Knee/Hip Exercises: Aerobic   Stationary Bike L3 x 5 min  Review of status with Pt   Elliptical L1 x 6 minutes  Therapist present for safety     Knee/Hip Exercises: Machines for Strengthening   Cybex Leg Press Seat 6: Bil 75# 10x, 90# 2x 10, LTLE 55# 10x, 60# 2x10     Knee/Hip Exercises: Standing   Knee Flexion  --  Standing on box, 3# 3x10, VC to not use QL   Lateral Step Up Left;2 sets;15 reps;Hand Hold: 0;Step Height: 6"   Forward Step Up Left;2 sets;15 reps;Step Height: 8"   Wall Squat --  With adductor squeeze 5 sec hold 10x   SLS --  On Blue pod for static balance then plyo toss 2x10                  PT Short Term Goals - 06/27/16 0837      PT SHORT TERM GOAL #3   Title improved AROM from 0-100 degrees for improved functional mobility such as going up and down stairs   Baseline 121 AROM   Status Achieved           PT Long Term Goals - 06/27/16 0803      PT LONG TERM GOAL #1   Title Pt will be independent with a HEP and safe gym exercises and use of equipment.   Time 8   Period Weeks   Status On-going     PT LONG TERM GOAL #2   Title Pt will be able to descend the stairs with no pain using no hand rails with step over step pattern.   Time 8   Period Weeks   Status On-going  PT LONG TERM GOAL #5   Title Quad and hamstring strength improved to 5/5 MMT for improved stability when walking and returning to active lifestyle   Time 8   Period Weeks   Status On-going               Plan - 06/27/16 5102    Clinical Impression Statement Pt continues to progress with strength and knee stability. Continues to have hamstring weakness and limited eccentric muscle control. Pt has met all short term goals and is able to actively flex knee to 121 in long sitting.    Rehab Potential Excellent   Clinical Impairments Affecting Rehab Potential none   PT Frequency 2x / week   PT Duration 8 weeks   PT Treatment/Interventions ADLs/Self Care Home Management;Biofeedback;Cryotherapy;Air traffic controller;Therapeutic activities;Therapeutic exercise;Neuromuscular re-education;Balance training;Patient/family education;Manual techniques;Scar mobilization;Passive range of motion;Dry needling;Taping;Vasopneumatic Device   PT Next Visit Plan  Single leg stability and closed chain strengthening   Consulted and Agree with Plan of Care Patient      Patient will benefit from skilled therapeutic intervention in order to improve the following deficits and impairments:  Abnormal gait, Decreased activity tolerance, Decreased endurance, Decreased mobility, Decreased range of motion, Decreased strength, Difficulty walking, Increased edema, Impaired flexibility, Pain  Visit Diagnosis: Stiffness of left knee, not elsewhere classified  Acute pain of left knee  Difficulty in walking, not elsewhere classified  Muscle weakness (generalized)     Problem List Patient Active Problem List   Diagnosis Date Noted  . Sprain of anterior cruciate ligament of left knee 01/25/2016  . Left knee pain 12/17/2015  . Left knee injury 12/17/2015    Mikle Bosworth PTA 06/27/2016, 8:43 AM  [facility] [address] [place], [place], [address] Phone: [phone]   Fax:  [phone]  Name: Tyler Lozano MRN: [id number] Date of Birth: 07-27-1990

## 2016-06-29 ENCOUNTER — Ambulatory Visit: Payer: Managed Care, Other (non HMO) | Admitting: Physical Therapy

## 2016-06-29 DIAGNOSIS — M6281 Muscle weakness (generalized): Secondary | ICD-10-CM

## 2016-06-29 DIAGNOSIS — R262 Difficulty in walking, not elsewhere classified: Secondary | ICD-10-CM

## 2016-06-29 DIAGNOSIS — M25662 Stiffness of left knee, not elsewhere classified: Secondary | ICD-10-CM

## 2016-06-29 DIAGNOSIS — M25562 Pain in left knee: Secondary | ICD-10-CM

## 2016-06-29 NOTE — Therapy (Signed)
[facility] [address] [place], [place], [address] Phone: [phone]   Fax:  [phone]  Physical Therapy Treatment  Patient Details  Name: Tyler Lozano MRN: [id number] Date of Birth: June 20, 1990 Referring Provider: Cammy Copa MD  Encounter Date: 06/29/2016      PT End of Session - 06/29/16 0838    Visit Number 11   Date for PT Re-Evaluation 07/20/16   PT Start Time 0758   PT Stop Time 0840   PT Time Calculation (min) 42 min   Activity Tolerance Patient tolerated treatment well   Behavior During Therapy [facility] for tasks assessed/performed      Past Medical History:  Diagnosis Date  . Anterior cruciate ligament complete tear    left    Past Surgical History:  Procedure Laterality Date  . ANTERIOR CRUCIATE LIGAMENT REPAIR Left 05/16/2016   Procedure: RECONSTRUCTION ANTERIOR CRUCIATE LIGAMENT (ACL);  Surgeon: Cammy Copa, MD;  Location: [facility];  Service: Orthopedics;  Laterality: Left;  . WISDOM TOOTH EXTRACTION      There were no vitals filed for this visit.      Subjective Assessment - 06/29/16 0837    Subjective No pain just still feeling swelling and stiffness.     Limitations Standing;Walking;Other (comment)                         [facility] Adult PT Treatment/Exercise - 06/29/16 0001      Neuro Re-ed    Neuro Re-ed Details  proprioception and cues for improved balance and muscle coordination     Knee/Hip Exercises: Stretches   Active Hamstring Stretch Left;2 reps;10 seconds   Gastroc Stretch Both;2 reps;10 seconds     Knee/Hip Exercises: Aerobic   Stationary Bike L3 x 7 min  Review of status with Pt     Knee/Hip Exercises: Machines for Strengthening   Cybex Leg Press Seat 6: Bil 90# x 10, 100# 2x10, LTLE 60# 2 x10, 65# x10     Knee/Hip Exercises: Standing   Knee Flexion Strengthening;Both;10 reps  Standing on box, 3# 3x10, VC to not use QL   Hip Flexion  Stengthening;Both;10 reps  4# on BOSU   Forward Lunges Both;15 reps  stationary   Hip Abduction Stengthening;Both;10 reps  4# on BOSU   Hip Extension Stengthening;Both;10 reps  4# on BOSU   Lateral Step Up Left;2 sets;Hand Hold: 0;10 reps  BOSU   Forward Step Up Left;2 sets;10 reps  BOSU   Other Standing Knee Exercises side stepping and monster walks - red band - 3 laps length of carpet                  PT Short Term Goals - 06/27/16 0837      PT SHORT TERM GOAL #3   Title improved AROM from 0-100 degrees for improved functional mobility such as going up and down stairs   Baseline 121 AROM   Status Achieved           PT Long Term Goals - 06/27/16 0803      PT LONG TERM GOAL #1   Title Pt will be independent with a HEP and safe gym exercises and use of equipment.   Time 8   Period Weeks   Status On-going     PT LONG TERM GOAL #2   Title Pt will be able to descend the stairs with no pain using no hand rails with step  over step pattern.   Time 8   Period Weeks   Status On-going     PT LONG TERM GOAL #5   Title Quad and hamstring strength improved to 5/5 MMT for improved stability when walking and returning to active lifestyle   Time 8   Period Weeks   Status On-going               Plan - 06/29/16 0840    Clinical Impression Statement Pt continues to demonstrate progress.  Able to perform exercises on BOSU.  Has difficulty with equal weight bearing in double leg exercises and needs cues to improve muscle coordination for functional movements   Rehab Potential Excellent   PT Treatment/Interventions ADLs/Self Care Home Management;Biofeedback;Cryotherapy;Lawyer;Therapeutic activities;Therapeutic exercise;Neuromuscular re-education;Balance training;Patient/family education;Manual techniques;Scar mobilization;Passive range of motion;Dry needling;Taping;Vasopneumatic Device   PT Next Visit Plan Single leg  stability and closed chain strengthening progress as tolerated   Consulted and Agree with Plan of Care Patient      Patient will benefit from skilled therapeutic intervention in order to improve the following deficits and impairments:  Abnormal gait, Decreased activity tolerance, Decreased endurance, Decreased mobility, Decreased range of motion, Decreased strength, Difficulty walking, Increased edema, Impaired flexibility, Pain  Visit Diagnosis: Stiffness of left knee, not elsewhere classified  Acute pain of left knee  Difficulty in walking, not elsewhere classified  Muscle weakness (generalized)     Problem List Patient Active Problem List   Diagnosis Date Noted  . Sprain of anterior cruciate ligament of left knee 01/25/2016  . Left knee pain 12/17/2015  . Left knee injury 12/17/2015    Vincente Poli, PT 06/29/2016, 8:42 AM  [facility] [address] [place], [place], [address] Phone: [phone]   Fax:  [phone]  Name: Tyler Lozano MRN: [id number] Date of Birth: 06-04-1990

## 2016-07-04 ENCOUNTER — Encounter: Payer: Self-pay | Admitting: Physical Therapy

## 2016-07-04 ENCOUNTER — Ambulatory Visit: Payer: Managed Care, Other (non HMO) | Admitting: Physical Therapy

## 2016-07-04 DIAGNOSIS — M25662 Stiffness of left knee, not elsewhere classified: Secondary | ICD-10-CM | POA: Diagnosis not present

## 2016-07-04 DIAGNOSIS — M25562 Pain in left knee: Secondary | ICD-10-CM

## 2016-07-04 DIAGNOSIS — R262 Difficulty in walking, not elsewhere classified: Secondary | ICD-10-CM

## 2016-07-04 DIAGNOSIS — M6281 Muscle weakness (generalized): Secondary | ICD-10-CM

## 2016-07-04 NOTE — Therapy (Signed)
[facility] [address] [place], [place], [address] Phone: [phone]   Fax:  [phone]  Physical Therapy Treatment  Patient Details  Name: Tyler Lozano MRN: [id number] Date of Birth: 03/10/1991 Referring Provider: Cammy Copa MD  Encounter Date: 07/04/2016      PT End of Session - 07/04/16 0828    Visit Number 12   Date for PT Re-Evaluation 07/20/16   PT Start Time 0758   PT Stop Time 0841   PT Time Calculation (min) 43 min   Activity Tolerance Patient tolerated treatment well   Behavior During Therapy [facility] for tasks assessed/performed      Past Medical History:  Diagnosis Date  . Anterior cruciate ligament complete tear    left    Past Surgical History:  Procedure Laterality Date  . ANTERIOR CRUCIATE LIGAMENT REPAIR Left 05/16/2016   Procedure: RECONSTRUCTION ANTERIOR CRUCIATE LIGAMENT (ACL);  Surgeon: Cammy Copa, MD;  Location: [facility] OR;  Service: Orthopedics;  Laterality: Left;  . WISDOM TOOTH EXTRACTION      There were no vitals filed for this visit.      Subjective Assessment - 07/04/16 0812    Subjective I've been doing the bike at the gym. No pain I just get tight in my hamstrings.    Patient is accompained by: Family member   Limitations Standing;Walking;Other (comment)   Patient Stated Goals return to regular activity, walking without crutches, and sports   Currently in Pain? No/denies   Pain Score 0-No pain                         [facility] Adult PT Treatment/Exercise - 07/04/16 0001      Therapeutic Activites    Therapeutic Activities ADL's   ADL's Walking, squatting, stepping     Knee/Hip Exercises: Stretches   Active Hamstring Stretch Left;2 reps;10 seconds   Gastroc Stretch Both;2 reps;10 seconds     Knee/Hip Exercises: Aerobic   Stationary Bike L4 x 6 minutes  Therapist present to discuss treatment     Knee/Hip Exercises: Machines for  Strengthening   Cybex Leg Press Seat 6: Bil x 10, 100# 2x10, LTLE  2 x10, 70# x10     Knee/Hip Exercises: Standing   Knee Flexion Strengthening;Both;10 reps  Standing on box, 4# 3x10, VC to not use QL   Hip Flexion Stengthening;Both;10 reps  4# on BOSU   Forward Lunges Both;15 reps  stationary   Hip Abduction Stengthening;Both;10 reps  4# on BOSU   Hip Extension Stengthening;Both;10 reps  4# on BOSU   Lateral Step Up --   Forward Step Up --   Rebounder 4 directions x 8 each   #35   Other Standing Knee Exercises side stepping and monster walks - red band - 3 laps length of carpet                  PT Short Term Goals - 06/27/16 0837      PT SHORT TERM GOAL #3   Title improved AROM from 0-100 degrees for improved functional mobility such as going up and down stairs   Baseline 121 AROM   Status Achieved           PT Long Term Goals - 07/04/16 0102      PT LONG TERM GOAL #1   Title Pt will be independent with a HEP and safe gym exercises and use of equipment.   Time  8   Period Weeks   Status On-going     PT LONG TERM GOAL #2   Title Pt will be able to descend the stairs with no pain using no hand rails with step over step pattern.   Baseline When legs are warm able   Time 8   Period Weeks   Status On-going     PT LONG TERM GOAL #4   Title Patient will have left knee flexion within 10 degrees of right knee for return to sports such as soccer   Time 8   Period Weeks   Status Achieved     PT LONG TERM GOAL #5   Title Quad and hamstring strength improved to 5/5 MMT for improved stability when walking and returning to active lifestyle   Time 8   Period Weeks   Status On-going               Plan - 07/04/16 0831    Clinical Impression Statement Pt continues to be limited with quad stability and eccentric control. VC for even weight distribution. Pt will continue to benefit from skilled therapy for knee strength and stability.    Rehab Potential  Excellent   Clinical Impairments Affecting Rehab Potential none   PT Frequency 2x / week   PT Duration 8 weeks   PT Treatment/Interventions ADLs/Self Care Home Management;Biofeedback;Cryotherapy;Lawyer;Therapeutic activities;Therapeutic exercise;Neuromuscular re-education;Balance training;Patient/family education;Manual techniques;Scar mobilization;Passive range of motion;Dry needling;Taping;Vasopneumatic Device   PT Next Visit Plan Single leg stability and closed chain strengthening progress as tolerated   Consulted and Agree with Plan of Care Patient      Patient will benefit from skilled therapeutic intervention in order to improve the following deficits and impairments:  Abnormal gait, Decreased activity tolerance, Decreased endurance, Decreased mobility, Decreased range of motion, Decreased strength, Difficulty walking, Increased edema, Impaired flexibility, Pain  Visit Diagnosis: Stiffness of left knee, not elsewhere classified  Acute pain of left knee  Difficulty in walking, not elsewhere classified  Muscle weakness (generalized)     Problem List Patient Active Problem List   Diagnosis Date Noted  . Sprain of anterior cruciate ligament of left knee 01/25/2016  . Left knee pain 12/17/2015  . Left knee injury 12/17/2015    Dessa Phi PTA 07/04/2016, 8:43 AM  [facility] [address] [place], [place], [address] Phone: [phone]   Fax:  [phone]  Name: Tyler Lozano MRN: [id number] Date of Birth: 1990/11/18

## 2016-07-05 ENCOUNTER — Ambulatory Visit: Payer: Managed Care, Other (non HMO) | Admitting: Physical Therapy

## 2016-07-06 ENCOUNTER — Encounter: Payer: Self-pay | Admitting: Physical Therapy

## 2016-07-06 ENCOUNTER — Ambulatory Visit: Payer: Managed Care, Other (non HMO) | Admitting: Physical Therapy

## 2016-07-06 DIAGNOSIS — M25662 Stiffness of left knee, not elsewhere classified: Secondary | ICD-10-CM

## 2016-07-06 DIAGNOSIS — M25562 Pain in left knee: Secondary | ICD-10-CM

## 2016-07-06 DIAGNOSIS — R262 Difficulty in walking, not elsewhere classified: Secondary | ICD-10-CM

## 2016-07-06 DIAGNOSIS — M6281 Muscle weakness (generalized): Secondary | ICD-10-CM

## 2016-07-06 NOTE — Therapy (Signed)
[facility] [address] [place], [place], [address] Phone: [phone]   Fax:  [phone]  Physical Therapy Treatment  Patient Details  Name: Tyler Lozano MRN: [id number] Date of Birth: November 18, 1990 Referring Provider: Cammy Copa MD  Encounter Date: 07/06/2016      PT End of Session - 07/06/16 0803    Visit Number 13   Date for PT Re-Evaluation 07/20/16   PT Start Time 0759   PT Stop Time 0843   PT Time Calculation (min) 44 min   Activity Tolerance Patient tolerated treatment well   Behavior During Therapy [facility] for tasks assessed/performed      Past Medical History:  Diagnosis Date  . Anterior cruciate ligament complete tear    left    Past Surgical History:  Procedure Laterality Date  . ANTERIOR CRUCIATE LIGAMENT REPAIR Left 05/16/2016   Procedure: RECONSTRUCTION ANTERIOR CRUCIATE LIGAMENT (ACL);  Surgeon: Cammy Copa, MD;  Location: [facility] OR;  Service: Orthopedics;  Laterality: Left;  . WISDOM TOOTH EXTRACTION      There were no vitals filed for this visit.      Subjective Assessment - 07/06/16 0801    Subjective Patient reports no knee pain, stairs are getting easier but best when knee is warmed up. Some difficulty with descending when tiered or stiff.    Patient is accompained by: Family member   Limitations Standing;Walking;Other (comment)   Patient Stated Goals return to regular activity, walking without crutches, and sports   Currently in Pain? No/denies   Pain Score 0-No pain                         [facility] Adult PT Treatment/Exercise - 07/06/16 0001      Therapeutic Activites    Therapeutic Activities ADL's     Knee/Hip Exercises: Stretches   Active Hamstring Stretch Left;2 reps;10 seconds   Gastroc Stretch Both;2 reps;10 seconds     Knee/Hip Exercises: Aerobic   Stationary Bike L4 x 6 minutes  Therapist present to discuss treatment     Knee/Hip Exercises:  Machines for Strengthening   Cybex Knee Flexion Eccentric only  #15 x15   Cybex Leg Press Seat 6: Bil x 10, 110# 2x10, LTLE  2 x10, 70# x10     Knee/Hip Exercises: Standing   Knee Flexion Strengthening;Both;10 reps  Standing on box, 4# 3x10, VC to not use QL   Hip Flexion Stengthening;Both;10 reps  4# on BOSU   Forward Lunges Both;15 reps  stationary   Hip Abduction Stengthening;Both;10 reps  4# on BOSU   Hip Extension Stengthening;Both;10 reps  4# on BOSU   Other Standing Knee Exercises side stepping and monster walks - green band - 3 laps length of carpet                  PT Short Term Goals - 06/27/16 0837      PT SHORT TERM GOAL #3   Title improved AROM from 0-100 degrees for improved functional mobility such as going up and down stairs   Baseline 121 AROM   Status Achieved           PT Long Term Goals - 07/04/16 1324      PT LONG TERM GOAL #1   Title Pt will be independent with a HEP and safe gym exercises and use of equipment.   Time 8   Period Weeks   Status On-going  PT LONG TERM GOAL #2   Title Pt will be able to descend the stairs with no pain using no hand rails with step over step pattern.   Baseline When legs are warm able   Time 8   Period Weeks   Status On-going     PT LONG TERM GOAL #4   Title Patient will have left knee flexion within 10 degrees of right knee for return to sports such as soccer   Time 8   Period Weeks   Status Achieved     PT LONG TERM GOAL #5   Title Quad and hamstring strength improved to 5/5 MMT for improved stability when walking and returning to active lifestyle   Time 8   Period Weeks   Status On-going               Plan - 07/06/16 6045    Clinical Impression Statement Pt doing well with single leg stability and balance. Pt did well with eccentric quad strengthening. will coninue to progress knee stability within protocol.    Rehab Potential Excellent   Clinical Impairments Affecting Rehab  Potential none   PT Frequency 2x / week   PT Duration 8 weeks   PT Treatment/Interventions ADLs/Self Care Home Management;Biofeedback;Cryotherapy;Lawyer;Therapeutic activities;Therapeutic exercise;Neuromuscular re-education;Balance training;Patient/family education;Manual techniques;Scar mobilization;Passive range of motion;Dry needling;Taping;Vasopneumatic Device   PT Next Visit Plan Single leg stability, eccentric control   PT Home Exercise Plan progress as needed   Consulted and Agree with Plan of Care Patient      Patient will benefit from skilled therapeutic intervention in order to improve the following deficits and impairments:  Abnormal gait, Decreased activity tolerance, Decreased endurance, Decreased mobility, Decreased range of motion, Decreased strength, Difficulty walking, Increased edema, Impaired flexibility, Pain  Visit Diagnosis: Stiffness of left knee, not elsewhere classified  Acute pain of left knee  Difficulty in walking, not elsewhere classified  Muscle weakness (generalized)     Problem List Patient Active Problem List   Diagnosis Date Noted  . Sprain of anterior cruciate ligament of left knee 01/25/2016  . Left knee pain 12/17/2015  . Left knee injury 12/17/2015    Dessa Phi PTA 07/06/2016, 8:44 AM  [facility] [address] [place], [place], [address] Phone: [phone]   Fax:  [phone]  Name: Hargis Vandyne MRN: [id number] Date of Birth: 07-12-1990

## 2016-07-11 ENCOUNTER — Encounter: Payer: Self-pay | Admitting: Physical Therapy

## 2016-07-11 ENCOUNTER — Ambulatory Visit: Payer: Managed Care, Other (non HMO) | Admitting: Physical Therapy

## 2016-07-11 DIAGNOSIS — M25662 Stiffness of left knee, not elsewhere classified: Secondary | ICD-10-CM | POA: Diagnosis not present

## 2016-07-11 DIAGNOSIS — R262 Difficulty in walking, not elsewhere classified: Secondary | ICD-10-CM

## 2016-07-11 DIAGNOSIS — M25562 Pain in left knee: Secondary | ICD-10-CM

## 2016-07-11 DIAGNOSIS — M6281 Muscle weakness (generalized): Secondary | ICD-10-CM

## 2016-07-11 NOTE — Therapy (Signed)
[facility] [address], [address] [place], [place], [address] Phone: [phone]   Fax:  [phone]  Physical Therapy Treatment  Patient Details  Name: Tyler Lozano MRN: [id number] Date of Birth: [birth year] Referring Provider: Cammy Copa MD  Encounter Date: 07/11/2016      PT End of Session - 07/11/16 0759    Visit Number 14   Date for PT Re-Evaluation 07/20/16   PT Start Time 0757   PT Stop Time 0837   PT Time Calculation (min) 40 min   Activity Tolerance Patient tolerated treatment well   Behavior During Therapy [facility] for tasks assessed/performed      Past Medical History:  Diagnosis Date  . Anterior cruciate ligament complete tear    left    Past Surgical History:  Procedure Laterality Date  . ANTERIOR CRUCIATE LIGAMENT REPAIR Left 05/16/2016   Procedure: RECONSTRUCTION ANTERIOR CRUCIATE LIGAMENT (ACL);  Surgeon: Cammy Copa, MD;  Location: [facility] OR;  Service: Orthopedics;  Laterality: Left;  . WISDOM TOOTH EXTRACTION      There were no vitals filed for this visit.                       [facility] Adult PT Treatment/Exercise - 07/11/16 0001      Therapeutic Activites    Therapeutic Activities ADL's   ADL's Walking, squatting, stepping     Knee/Hip Exercises: Stretches   Active Hamstring Stretch Left;2 reps;10 seconds   Gastroc Stretch Both;2 reps;10 seconds     Knee/Hip Exercises: Aerobic   Elliptical L4 x 8 minutes  Therapist present to discuss treatment     Knee/Hip Exercises: Machines for Strengthening   Cybex Knee Flexion Eccentric only  #15 x15     Knee/Hip Exercises: Standing   Knee Flexion Strengthening;Both;10 reps  #5   SLS --  On Blue pod for static balance then plyo toss at [employer] 4 directions x 8 each   #45   Other Standing Knee Exercises Hip extension/ abduction sliders   Other Standing Knee Exercises Dead lift with green band  x20                   PT Short Term Goals - 06/27/16 4332      PT SHORT TERM GOAL #3   Title improved AROM from 0-100 degrees for improved functional mobility such as going up and down stairs   Baseline 121 AROM   Status Achieved           PT Long Term Goals - 07/11/16 0759      PT LONG TERM GOAL #1   Title Pt will be independent with a HEP and safe gym exercises and use of equipment.   Time 8   Period Weeks   Status On-going     PT LONG TERM GOAL #2   Title Pt will be able to descend the stairs with no pain using no hand rails with step over step pattern.   Time 8   Period Weeks   Status Achieved     PT LONG TERM GOAL #3   Title Pt will improve his FOTO from 70% limiation to </= 39% limitation.    Time 8   Period Weeks   Status Achieved               Plan - 07/11/16 0830    Clinical Impression Statement Pt able to tolerate all exercises well. Does  well with eccentric quad exercises. Pt has returned to gym but is only doing bike and eliptical for LE strengthening. Will practice squats and LE strengthening at gym at next visit. Pt will continue to benefit from skilled therapy for strengthneing and knee stability.    Rehab Potential Excellent   Clinical Impairments Affecting Rehab Potential none   PT Frequency 2x / week   PT Duration 8 weeks   PT Treatment/Interventions ADLs/Self Care Home Management;Biofeedback;Cryotherapy;Lawyer;Therapeutic activities;Therapeutic exercise;Neuromuscular re-education;Balance training;Patient/family education;Manual techniques;Scar mobilization;Passive range of motion;Dry needling;Taping;Vasopneumatic Device   PT Next Visit Plan Begin plyometrics May 1 (week 10) initiate running program week 10-12   Consulted and Agree with Plan of Care Patient      Patient will benefit from skilled therapeutic intervention in order to improve the following deficits and impairments:  Abnormal  gait, Decreased activity tolerance, Decreased endurance, Decreased mobility, Decreased range of motion, Decreased strength, Difficulty walking, Increased edema, Impaired flexibility, Pain  Visit Diagnosis: Stiffness of left knee, not elsewhere classified  Acute pain of left knee  Difficulty in walking, not elsewhere classified  Muscle weakness (generalized)     Problem List Patient Active Problem List   Diagnosis Date Noted  . Sprain of anterior cruciate ligament of left knee 01/25/2016  . Left knee pain 12/17/2015  . Left knee injury 12/17/2015    Dessa Phi PTA 07/11/2016, 8:39 AM  [facility] [address] [place], [place], [address] Phone: [phone]   Fax:  [phone]  Name: Tyler Lozano MRN: [id number] Date of Birth: 07/23/1990

## 2016-07-13 ENCOUNTER — Ambulatory Visit: Payer: Managed Care, Other (non HMO) | Admitting: Physical Therapy

## 2016-07-13 ENCOUNTER — Encounter: Payer: Self-pay | Admitting: Physical Therapy

## 2016-07-13 DIAGNOSIS — M6281 Muscle weakness (generalized): Secondary | ICD-10-CM

## 2016-07-13 DIAGNOSIS — M25662 Stiffness of left knee, not elsewhere classified: Secondary | ICD-10-CM

## 2016-07-13 DIAGNOSIS — M25562 Pain in left knee: Secondary | ICD-10-CM

## 2016-07-13 DIAGNOSIS — R262 Difficulty in walking, not elsewhere classified: Secondary | ICD-10-CM

## 2016-07-13 NOTE — Therapy (Signed)
[facility] [address] [place], [place], [address] Phone: [phone]   Fax:  [phone]  Physical Therapy Treatment  Patient Details  Name: Tyler Lozano MRN: [id number] Date of Birth: Oct 28, 1990 Referring Provider: Cammy Copa MD  Encounter Date: 07/13/2016      PT End of Session - 07/13/16 0800    Visit Number 15   Date for PT Re-Evaluation 07/20/16   PT Start Time 0756   PT Stop Time 0840   PT Time Calculation (min) 44 min   Activity Tolerance Patient tolerated treatment well   Behavior During Therapy [facility] for tasks assessed/performed      Past Medical History:  Diagnosis Date  . Anterior cruciate ligament complete tear    left    Past Surgical History:  Procedure Laterality Date  . ANTERIOR CRUCIATE LIGAMENT REPAIR Left 05/16/2016   Procedure: RECONSTRUCTION ANTERIOR CRUCIATE LIGAMENT (ACL);  Surgeon: Cammy Copa, MD;  Location: [facility] OR;  Service: Orthopedics;  Laterality: Left;  . WISDOM TOOTH EXTRACTION      There were no vitals filed for this visit.      Subjective Assessment - 07/13/16 0758    Subjective Pt denies knee pain   Patient is accompained by: Family member   Limitations Standing;Walking;Other (comment)   Patient Stated Goals return to regular activity, walking without crutches, and sports   Currently in Pain? No/denies   Pain Score 0-No pain                         [facility] Adult PT Treatment/Exercise - 07/13/16 0001      Knee/Hip Exercises: Aerobic   Elliptical L4 x 6 minutes  Therapist present to discuss treatment     Knee/Hip Exercises: Machines for Strengthening   Cybex Leg Press Plyometric #50 2x10  Seat 6     Knee/Hip Exercises: Standing   Forward Lunges Both;15 reps  stationary and walking for form   Terminal Knee Extension Limitations Green band x40   Functional Squat 3 sets;10 reps  Verbal and tactile cues for knee stability   Other  Standing Knee Exercises Hip extension/ abduction sliders   Other Standing Knee Exercises Dead lift with black band  x20                  PT Short Term Goals - 06/27/16 0272      PT SHORT TERM GOAL #3   Title improved AROM from 0-100 degrees for improved functional mobility such as going up and down stairs   Baseline 121 AROM   Status Achieved           PT Long Term Goals - 07/11/16 0759      PT LONG TERM GOAL #1   Title Pt will be independent with a HEP and safe gym exercises and use of equipment.   Time 8   Period Weeks   Status On-going     PT LONG TERM GOAL #2   Title Pt will be able to descend the stairs with no pain using no hand rails with step over step pattern.   Time 8   Period Weeks   Status Achieved     PT LONG TERM GOAL #3   Title Pt will improve his FOTO from 70% limiation to </= 39% limitation.    Time 8   Period Weeks   Status Achieved  Plan - 07/13/16 0847    Clinical Impression Statement Pt progressing well with strength and knee stability. Pt reports having difficulty with lunges when Lt knee is in back feels like it won't bend, possibly due to hamstring weakness. Pt haveing some knee instability with squats but able to correct with cueing. Pt will continue to benefit from skilled therapy for knee strenght and stability.    Rehab Potential Excellent   Clinical Impairments Affecting Rehab Potential none   PT Frequency 2x / week   PT Duration 8 weeks   PT Treatment/Interventions ADLs/Self Care Home Management;Biofeedback;Cryotherapy;Lawyer;Therapeutic activities;Therapeutic exercise;Neuromuscular re-education;Balance training;Patient/family education;Manual techniques;Scar mobilization;Passive range of motion;Dry needling;Taping;Vasopneumatic Device   PT Next Visit Plan Hamstring strength and practice lunge and squat; Begin plyometrics May 1 (week 10) initiate running  program week 10-12   Consulted and Agree with Plan of Care Patient      Patient will benefit from skilled therapeutic intervention in order to improve the following deficits and impairments:  Abnormal gait, Decreased activity tolerance, Decreased endurance, Decreased mobility, Decreased range of motion, Decreased strength, Difficulty walking, Increased edema, Impaired flexibility, Pain  Visit Diagnosis: Stiffness of left knee, not elsewhere classified  Acute pain of left knee  Difficulty in walking, not elsewhere classified  Muscle weakness (generalized)     Problem List Patient Active Problem List   Diagnosis Date Noted  . Sprain of anterior cruciate ligament of left knee 01/25/2016  . Left knee pain 12/17/2015  . Left knee injury 12/17/2015    Dessa Phi PTA 07/13/2016, 8:56 AM  [facility] [address] [place], [place], [address] Phone: [phone]   Fax:  [phone]  Name: Tyler Lozano MRN: [id number] Date of Birth: [birth year]

## 2016-07-19 ENCOUNTER — Ambulatory Visit: Payer: Managed Care, Other (non HMO) | Admitting: Physical Therapy

## 2016-07-19 ENCOUNTER — Encounter: Payer: Self-pay | Admitting: Physical Therapy

## 2016-07-19 DIAGNOSIS — M6281 Muscle weakness (generalized): Secondary | ICD-10-CM

## 2016-07-19 DIAGNOSIS — M25662 Stiffness of left knee, not elsewhere classified: Secondary | ICD-10-CM | POA: Diagnosis not present

## 2016-07-19 DIAGNOSIS — R262 Difficulty in walking, not elsewhere classified: Secondary | ICD-10-CM

## 2016-07-19 NOTE — Therapy (Signed)
[facility] [address] [place], [place], [address] Phone: [phone]   Fax:  [phone]  Physical Therapy Treatment  Patient Details  Name: Tyler Lozano MRN: [id number] Date of Birth: 09-May-1990 Referring Provider: Dr. Cammy Copa  Encounter Date: 07/19/2016      PT End of Session - 07/19/16 0844    Visit Number 16   Date for PT Re-Evaluation 08/29/16   PT Start Time 0800   PT Stop Time 0843   PT Time Calculation (min) 43 min   Activity Tolerance Patient tolerated treatment well   Behavior During Therapy [facility] for tasks assessed/performed      Past Medical History:  Diagnosis Date  . Anterior cruciate ligament complete tear    left    Past Surgical History:  Procedure Laterality Date  . ANTERIOR CRUCIATE LIGAMENT REPAIR Left 05/16/2016   Procedure: RECONSTRUCTION ANTERIOR CRUCIATE LIGAMENT (ACL);  Surgeon: Cammy Copa, MD;  Location: [facility] OR;  Service: Orthopedics;  Laterality: Left;  . WISDOM TOOTH EXTRACTION      There were no vitals filed for this visit.      Subjective Assessment - 07/19/16 0807    Subjective I feel really good.  No pain.  I still have swelling in the inside of my knee due to when squatting I feel some resistance.    Limitations Standing;Walking;Other (comment)   Patient Stated Goals return to regular activity, walking without crutches, and sports   Currently in Pain? No/denies            [facility] PT Assessment - 07/19/16 0001      Assessment   Medical Diagnosis S83.521D sprain of left ACL left knee   Referring Provider Dr. Cammy Copa   Onset Date/Surgical Date 05/16/16     Precautions   Precautions Knee     Restrictions   Weight Bearing Restrictions No     Balance Screen   Has the patient fallen in the past 6 months No   Has the patient had a decrease in activity level because of a fear of falling?  No   Is the patient reluctant to leave their  home because of a fear of falling?  No     Home Environment   Living Environment Private residence   Available Help at Discharge Family     Prior Function   Level of Independence Independent   Vocation Full time employment     Cognition   Overall Cognitive Status Within Functional Limits for tasks assessed     Observation/Other Assessments   Focus on Therapeutic Outcomes (FOTO)  70% limited     AROM   AROM Assessment Site Knee   Right/Left Knee Left   Left Knee Extension 0   Left Knee Flexion 125  pain at end range     PROM   Right/Left Knee Left   Left Knee Extension 0   Left Knee Flexion 130  pain at end range     Strength   Left Hip Flexion 5/5   Left Hip ABduction 5/5   Left Knee Flexion 4+/5   Left Knee Extension 4-/5     Palpation   Patella mobility moves well in all planes, swelling limiting mobility                     [facility] Adult PT Treatment/Exercise - 07/19/16 0001      Knee/Hip Exercises: Aerobic   Elliptical L4 x 6 minutes  Therapist present to discuss treatment     Knee/Hip Exercises: Machines for Strengthening   Cybex Leg Press Plyometric #50 2x10; bil. 130# 3x10; Left 70 3x10  Seat 6     Knee/Hip Exercises: Standing   Forward Lunges Left;Right;10 reps  worked on pelvic alignment   Other Standing Knee Exercises stand on flat side of BOSU ball 10 mini squats, mini squat with body blade moveing forward and back up over head     Knee/Hip Exercises: Seated   Long Arc Quad Left;3 sets;10 reps  5# 90-40 degrees     Ankle Exercises: Standing   Heel Raises 15 reps  slowly on step; 10 quick 2 x 10                PT Education - 07/19/16 0843    Education provided Yes   Education Details running in pool forward and backward   Person(s) Educated Patient   Methods Explanation   Comprehension Verbalized understanding          PT Short Term Goals - 06/27/16 0837      PT SHORT TERM GOAL #3   Title improved AROM from  0-100 degrees for improved functional mobility such as going up and down stairs   Baseline 121 AROM   Status Achieved           PT Long Term Goals - 07/19/16 0810      PT LONG TERM GOAL #1   Title Pt will be independent with a HEP and safe gym exercises and use of equipment.   Time 8   Period Weeks   Status On-going  still learning     PT LONG TERM GOAL #2   Title Pt will be able to descend the stairs with no pain using no hand rails with step over step pattern.   Baseline When legs are warm able   Time 8   Period Weeks   Status Achieved     PT LONG TERM GOAL #3   Title Pt will improve his FOTO from 70% limiation to </= 39% limitation.    Baseline 36% limited   Time 8   Period Weeks   Status Achieved     PT LONG TERM GOAL #4   Title Patient will have left knee flexion within 10 degrees of right knee for return to sports such as soccer   Baseline pt unable to currently due to increased pain   Time 8   Period Weeks   Status Achieved     PT LONG TERM GOAL #5   Title Quad and hamstring strength improved to 5/5 MMT for improved stability when walking and returning to active lifestyle   Time 8   Period Weeks   Status On-going     Additional Long Term Goals   Additional Long Term Goals Yes     PT LONG TERM GOAL #6   Title ability to fully squat with no limitation due to full ROM and strength   Time 6   Period Weeks   Status New     PT LONG TERM GOAL #7   Title ability to start running program without difficulty   Time 6   Period Weeks   Status New     PT LONG TERM GOAL #8   Title ability to go down steps or incline with a controlled manner going down with left due to improved eccentric contraction   Time 6   Period Weeks   Status New  Plan - 07/19/16 0844    Clinical Impression Statement Patient is not having pain in left knee.  Patient has full strength in left hip.  Patient has weakness in left quads and hamstring.  Patient has full  left knee extension and flexion is 125 AROM and 130 degrees PROM but has pain and stiffness at end range.  Patient has difficulty with squats due to weakness and control.  Patient has decreased control with going down stairs and incline due to decreased control and eccentric contration of the right quads.  Patient will benefit from skilled therapy to work on left knee control and strength to restore prior function.    Rehab Potential Excellent   Clinical Impairments Affecting Rehab Potential none   PT Frequency 2x / week   PT Duration 8 weeks   PT Treatment/Interventions ADLs/Self Care Home Management;Biofeedback;Cryotherapy;Lawyer;Therapeutic activities;Therapeutic exercise;Neuromuscular re-education;Balance training;Patient/family education;Manual techniques;Scar mobilization;Passive range of motion;Dry needling;Taping;Vasopneumatic Device   PT Next Visit Plan Hamstring strength and practice lunge and squat; Begin plyometrics May 1 (week 10) initiate running program week 10-12   PT Home Exercise Plan progress as needed      Patient will benefit from skilled therapeutic intervention in order to improve the following deficits and impairments:  Abnormal gait, Decreased activity tolerance, Decreased endurance, Decreased mobility, Decreased range of motion, Decreased strength, Difficulty walking, Increased edema, Impaired flexibility, Pain  Visit Diagnosis: Stiffness of left knee, not elsewhere classified - Plan: PT plan of care cert/re-cert  Muscle weakness (generalized) - Plan: PT plan of care cert/re-cert  Difficulty in walking, not elsewhere classified - Plan: PT plan of care cert/re-cert     Problem List Patient Active Problem List   Diagnosis Date Noted  . Sprain of anterior cruciate ligament of left knee 01/25/2016  . Left knee pain 12/17/2015  . Left knee injury 12/17/2015    Eulis Foster, PT 07/19/16 8:49 AM   [facility] [address], [address] [place], [place], [address] Phone: [phone]   Fax:  [phone]  Name: Tyler Lozano MRN: [id number] Date of Birth: 09/06/1990

## 2016-07-25 ENCOUNTER — Ambulatory Visit: Payer: Managed Care, Other (non HMO) | Attending: Orthopedic Surgery

## 2016-07-25 DIAGNOSIS — M25562 Pain in left knee: Secondary | ICD-10-CM | POA: Insufficient documentation

## 2016-07-25 DIAGNOSIS — M6281 Muscle weakness (generalized): Secondary | ICD-10-CM | POA: Diagnosis present

## 2016-07-25 DIAGNOSIS — R262 Difficulty in walking, not elsewhere classified: Secondary | ICD-10-CM | POA: Insufficient documentation

## 2016-07-25 DIAGNOSIS — M25662 Stiffness of left knee, not elsewhere classified: Secondary | ICD-10-CM | POA: Insufficient documentation

## 2016-07-25 NOTE — Therapy (Signed)
[facility] [address] [place], [place], [address] Phone: [phone]   Fax:  [phone]  Physical Therapy Treatment  Patient Details  Name: Tyler Lozano MRN: [id number] Date of Birth: 05-07-1990 Referring Provider: Dr. Cammy Copa  Encounter Date: 07/25/2016      PT End of Session - 07/25/16 0839    Visit Number 17   Date for PT Re-Evaluation 08/29/16   PT Start Time 0800   PT Stop Time 0839   PT Time Calculation (min) 39 min   Activity Tolerance Patient tolerated treatment well   Behavior During Therapy [facility] for tasks assessed/performed      Past Medical History:  Diagnosis Date  . Anterior cruciate ligament complete tear    left    Past Surgical History:  Procedure Laterality Date  . ANTERIOR CRUCIATE LIGAMENT REPAIR Left 05/16/2016   Procedure: RECONSTRUCTION ANTERIOR CRUCIATE LIGAMENT (ACL);  Surgeon: Cammy Copa, MD;  Location: [facility] OR;  Service: Orthopedics;  Laterality: Left;  . WISDOM TOOTH EXTRACTION      There were no vitals filed for this visit.      Subjective Assessment - 07/25/16 0808    Subjective Not sure when I see the MD.     Currently in Pain? No/denies                         [facility] Adult PT Treatment/Exercise - 07/25/16 0001      Knee/Hip Exercises: Aerobic   Elliptical L4 x 6 minutes  Therapist present to discuss treatment     Knee/Hip Exercises: Machines for Strengthening   Cybex Leg Press Plyometric 50# 2x10; bil. 130# 3x10; Left 70 3x10  Seat 6     Knee/Hip Exercises: Standing   Walking with Sports Cord 30# forward and reverse, bil sidestepping x 10 each   Other Standing Knee Exercises stand on flat side of BOSU ball 2x10 mini squats   Other Standing Knee Exercises Agility Ladder: fast feet forward, sidestepping and forward hopping up and down 3 times each move.     Knee/Hip Exercises: Seated   Long Arc Quad Left;3 sets;10 reps  5# 90-40  degrees                  PT Short Term Goals - 06/27/16 0837      PT SHORT TERM GOAL #3   Title improved AROM from 0-100 degrees for improved functional mobility such as going up and down stairs   Baseline 121 AROM   Status Achieved           PT Long Term Goals - 07/25/16 0809      PT LONG TERM GOAL #1   Title Pt will be independent with a HEP and safe gym exercises and use of equipment.   Time 8   Period Weeks   Status On-going     PT LONG TERM GOAL #5   Title Quad and hamstring strength improved to 5/5 MMT for improved stability when walking and returning to active lifestyle   Time 8   Period Weeks   Status On-going     PT LONG TERM GOAL #6   Title ability to fully squat with no limitation due to full ROM and strength   Time 6   Period Weeks   Status On-going     PT LONG TERM GOAL #8   Title ability to go down steps or incline with a controlled  manner going down with left due to improved eccentric contraction   Time 6   Period Weeks   Status On-going               Plan - 07/25/16 0810    Clinical Impression Statement Pt is progressing well s/p Lt ACL repair.  Pt with Lt quad and hamstring weakness.  Pt with difficulty with squats due to weakness and reduced control.  Pt will continue to benefit from skilled PT for proprioception, strength and endurance of Lt knee for safe return to running and sports.     Rehab Potential Excellent   PT Frequency 2x / week   PT Duration 8 weeks   PT Treatment/Interventions ADLs/Self Care Home Management;Biofeedback;Cryotherapy;Lawyer;Therapeutic activities;Therapeutic exercise;Neuromuscular re-education;Balance training;Patient/family education;Manual techniques;Scar mobilization;Passive range of motion;Dry needling;Taping;Vasopneumatic Device   PT Next Visit Plan Hamstring strength and practice lunge and squat; Begin plyometrics May 1 (week 10) initiate running  program week 10-12   Consulted and Agree with Plan of Care Patient      Patient will benefit from skilled therapeutic intervention in order to improve the following deficits and impairments:  Abnormal gait, Decreased activity tolerance, Decreased endurance, Decreased mobility, Decreased range of motion, Decreased strength, Difficulty walking, Increased edema, Impaired flexibility, Pain  Visit Diagnosis: Stiffness of left knee, not elsewhere classified  Muscle weakness (generalized)     Problem List Patient Active Problem List   Diagnosis Date Noted  . Sprain of anterior cruciate ligament of left knee 01/25/2016  . Left knee pain 12/17/2015  . Left knee injury 12/17/2015    Lorrene Reid, PT 07/25/16 8:41 AM  [facility] [address], [address] [place], [place], [address] Phone: [phone]   Fax:  [phone]  Name: Tyler Lozano MRN: [id number] Date of Birth: 11/06/1990

## 2016-07-27 ENCOUNTER — Ambulatory Visit: Payer: Managed Care, Other (non HMO) | Admitting: Physical Therapy

## 2016-07-27 ENCOUNTER — Encounter: Payer: Self-pay | Admitting: Physical Therapy

## 2016-07-27 DIAGNOSIS — M25562 Pain in left knee: Secondary | ICD-10-CM

## 2016-07-27 DIAGNOSIS — R262 Difficulty in walking, not elsewhere classified: Secondary | ICD-10-CM

## 2016-07-27 DIAGNOSIS — M25662 Stiffness of left knee, not elsewhere classified: Secondary | ICD-10-CM | POA: Diagnosis not present

## 2016-07-27 DIAGNOSIS — M6281 Muscle weakness (generalized): Secondary | ICD-10-CM

## 2016-07-27 NOTE — Therapy (Signed)
[facility] [address], [address] [place], [place], [address] Phone: [phone]   Fax:  [phone]  Physical Therapy Treatment  Patient Details  Name: Tyler Lozano MRN: [id number] Date of Birth: 18-Jul-1990 Referring Provider: Dr. Cammy Copa  Encounter Date: 07/27/2016      PT End of Session - 07/27/16 0756    Visit Number 18   Date for PT Re-Evaluation 08/29/16   PT Start Time 0753   PT Stop Time 0835   PT Time Calculation (min) 42 min   Activity Tolerance Patient tolerated treatment well   Behavior During Therapy [facility] for tasks assessed/performed      Past Medical History:  Diagnosis Date  . Anterior cruciate ligament complete tear    left    Past Surgical History:  Procedure Laterality Date  . ANTERIOR CRUCIATE LIGAMENT REPAIR Left 05/16/2016   Procedure: RECONSTRUCTION ANTERIOR CRUCIATE LIGAMENT (ACL);  Surgeon: Cammy Copa, MD;  Location: [facility] OR;  Service: Orthopedics;  Laterality: Left;  . WISDOM TOOTH EXTRACTION      There were no vitals filed for this visit.      Subjective Assessment - 07/27/16 0755    Subjective Knee feeling tight today but no pain.    Patient is accompained by: Family member   Limitations Standing;Walking;Other (comment)   Patient Stated Goals return to regular activity, walking without crutches, and sports   Currently in Pain? No/denies   Pain Score 0-No pain                         [facility] Adult PT Treatment/Exercise - 07/27/16 0001      Knee/Hip Exercises: Aerobic   Stationary Bike L4 x 6 minutes   Elliptical L4 x 6 minutes  Therapist present to discuss treatment     Knee/Hip Exercises: Machines for Strengthening   Cybex Leg Press Plyometric 50# 2x10; bil. 130# 3x10; Left 70 3x10  Seat 6     Knee/Hip Exercises: Standing   Walking with Sports Cord 30# forward and reverse, bil sidestepping x 10 each   Other Standing Knee Exercises stand on flat  side of BOSU ball 2x10 mini squats  And ball toss   Other Standing Knee Exercises Agility Ladder: fast feet forward, sidestepping and forward hopping up and down 3 times each move.     Knee/Hip Exercises: Seated   Long Arc Quad Left;3 sets;10 reps  5# 90-40 degrees                  PT Short Term Goals - 06/27/16 0837      PT SHORT TERM GOAL #3   Title improved AROM from 0-100 degrees for improved functional mobility such as going up and down stairs   Baseline 121 AROM   Status Achieved           PT Long Term Goals - 07/27/16 0757      PT LONG TERM GOAL #1   Title Pt will be independent with a HEP and safe gym exercises and use of equipment.   Time 8   Period Weeks   Status On-going     PT LONG TERM GOAL #2   Title Pt will be able to descend the stairs with no pain using no hand rails with step over step pattern.   Status Achieved     PT LONG TERM GOAL #3   Title Pt will improve his FOTO from 70% limiation to </=  39% limitation.    Status Achieved     PT LONG TERM GOAL #5   Title Quad and hamstring strength improved to 5/5 MMT for improved stability when walking and returning to active lifestyle   Time 8   Period Weeks   Status On-going               Plan - 07/27/16 0831    Clinical Impression Statement Patient continues to have decreased LE control for functional exercises and activities. Patient with decreased endurance and LE coordination. Patient will continue to benefit from skilled thearpy for strengthening and stabilization.    Rehab Potential Excellent   Clinical Impairments Affecting Rehab Potential none   PT Frequency 2x / week   PT Duration 8 weeks   PT Treatment/Interventions ADLs/Self Care Home Management;Biofeedback;Cryotherapy;Lawyerlectrical Stimulation;Moist Heat;Ultrasound;Gait training;Therapeutic activities;Therapeutic exercise;Neuromuscular re-education;Balance training;Patient/family education;Manual techniques;Scar  mobilization;Passive range of motion;Dry needling;Taping;Vasopneumatic Device   PT Next Visit Plan LE strength and endurance   Consulted and Agree with Plan of Care Patient      Patient will benefit from skilled therapeutic intervention in order to improve the following deficits and impairments:  Abnormal gait, Decreased activity tolerance, Decreased endurance, Decreased mobility, Decreased range of motion, Decreased strength, Difficulty walking, Increased edema, Impaired flexibility, Pain  Visit Diagnosis: Stiffness of left knee, not elsewhere classified  Muscle weakness (generalized)  Difficulty in walking, not elsewhere classified  Acute pain of left knee     Problem List Patient Active Problem List   Diagnosis Date Noted  . Sprain of anterior cruciate ligament of left knee 01/25/2016  . Left knee pain 12/17/2015  . Left knee injury 12/17/2015    Dessa PhiKatherine Teauna Dubach PTA 07/27/2016, 8:38 AM  [facility] [address], [address] [place], [place], [address] Phone: [phone]   Fax:  [phone]  Name: Tyler Lozano MRN: [id number] Date of Birth: 08/08/1990

## 2016-08-01 ENCOUNTER — Ambulatory Visit: Payer: Managed Care, Other (non HMO)

## 2016-08-01 DIAGNOSIS — M25662 Stiffness of left knee, not elsewhere classified: Secondary | ICD-10-CM

## 2016-08-01 DIAGNOSIS — M6281 Muscle weakness (generalized): Secondary | ICD-10-CM

## 2016-08-01 NOTE — Therapy (Signed)
[facility] [address], [address] [place], [place], [address] Phone: [phone]   Fax:  [phone]  Physical Therapy Treatment  Patient Details  Name: Tyler Lozano MRN: [id number] Date of Birth: 08/24/1990 Referring Provider: Dr. Cammy CopaScott Gregory Dean  Encounter Date: 08/01/2016      PT End of Session - 08/01/16 0851    Visit Number 19   Date for PT Re-Evaluation 08/29/16   PT Start Time 0804   PT Stop Time 0850   PT Time Calculation (min) 46 min   Activity Tolerance Patient tolerated treatment well   Behavior During Therapy [facility] for tasks assessed/performed      Past Medical History:  Diagnosis Date  . Anterior cruciate ligament complete tear    left    Past Surgical History:  Procedure Laterality Date  . ANTERIOR CRUCIATE LIGAMENT REPAIR Left 05/16/2016   Procedure: RECONSTRUCTION ANTERIOR CRUCIATE LIGAMENT (ACL);  Surgeon: Cammy CopaScott Gregory Dean, MD;  [facility];  Service: Orthopedics;  Laterality: Left;  . WISDOM TOOTH EXTRACTION      There were no vitals filed for this visit.      Subjective Assessment - 08/01/16 0825    Subjective Pt did some light running.  25 minutes with a few rest breaks.  It was challenging.     Patient Stated Goals return to regular activity, walking without crutches, and sports   Currently in Pain? No/denies                         [facility] Treatment/Exercise - 08/01/16 0001      Knee/Hip Exercises: Aerobic   Elliptical L4 x 10 minutes  Therapist present to discuss treatment     Knee/Hip Exercises: Machines for Strengthening   Cybex Leg Press Plyometric 50# 2x10; bil. 130# 3x10; Left 80# 3x10  Seat 6     Knee/Hip Exercises: Standing   Walking with Sports Cord 45# forward and reverse, bil sidestepping x 10 each   Other Standing Knee Exercises stand on flat side of BOSU ball 2x10 mini squats  And ball toss   Other Standing Knee Exercises Agility Ladder:  fast feet forward, sidestepping and forward hopping up and down 3 times each move.     Knee/Hip Exercises: Seated   Long Arc Quad Left;3 sets;10 reps  5# 90-40 degrees                  PT Short Term Goals - 06/27/16 0837      PT SHORT TERM GOAL #3   Title improved AROM from 0-100 degrees for improved functional mobility such as going up and down stairs   Baseline 121 AROM   Status Achieved           PT Long Term Goals - 08/01/16 0826      PT LONG TERM GOAL #1   Title Pt will be independent with a HEP and safe gym exercises and use of equipment.   Time 8   Period Weeks   Status On-going     PT LONG TERM GOAL #5   Title Quad and hamstring strength improved to 5/5 MMT for improved stability when walking and returning to active lifestyle   Time 8   Period Weeks   Status On-going     PT LONG TERM GOAL #7   Title ability to start running program without difficulty   Time 6   Period Weeks   Status On-going  Plan - 08/01/16 2956    Clinical Impression Statement Pt continues to progress with agility exercises in the clinic.  Pt started running over the weekend.  Pt reports that his Rt LE felt fatigue and some pain. Pt able to tolerate increased weight with single leg press and resisted walking.  Pt with continued reduction in eccentric strength with descending steps.  Pt will continue to benefit from skilled PT for advancement of Rt LE strength, proprioception and endurance.   Rehab Potential Excellent   PT Frequency 2x / week   PT Duration 8 weeks   PT Treatment/Interventions ADLs/Self Care Home Management;Biofeedback;Cryotherapy;Lawyer;Therapeutic activities;Therapeutic exercise;Neuromuscular re-education;Balance training;Patient/family education;Manual techniques;Scar mobilization;Passive range of motion;Dry needling;Taping;Vasopneumatic Device   PT Next Visit Plan LE strength and endurance    Consulted and Agree with Plan of Care Patient      Patient will benefit from skilled therapeutic intervention in order to improve the following deficits and impairments:  Abnormal gait, Decreased activity tolerance, Decreased endurance, Decreased mobility, Decreased range of motion, Decreased strength, Difficulty walking, Increased edema, Impaired flexibility, Pain  Visit Diagnosis: Stiffness of left knee, not elsewhere classified  Muscle weakness (generalized)     Problem List Patient Active Problem List   Diagnosis Date Noted  . Sprain of anterior cruciate ligament of left knee 01/25/2016  . Left knee pain 12/17/2015  . Left knee injury 12/17/2015    Lorrene Reid, PT 08/01/16 8:55 AM  [facility] [address] [place], [place], [address] Phone: [phone]   Fax:  [phone]  Name: Tyler Lozano MRN: [id number] Date of Birth: April 18, 1990

## 2016-08-03 ENCOUNTER — Encounter: Payer: Self-pay | Admitting: Physical Therapy

## 2016-08-03 ENCOUNTER — Ambulatory Visit: Payer: Managed Care, Other (non HMO) | Admitting: Physical Therapy

## 2016-08-03 DIAGNOSIS — M6281 Muscle weakness (generalized): Secondary | ICD-10-CM

## 2016-08-03 DIAGNOSIS — M25662 Stiffness of left knee, not elsewhere classified: Secondary | ICD-10-CM

## 2016-08-03 DIAGNOSIS — R262 Difficulty in walking, not elsewhere classified: Secondary | ICD-10-CM

## 2016-08-03 DIAGNOSIS — M25562 Pain in left knee: Secondary | ICD-10-CM

## 2016-08-03 NOTE — Therapy (Signed)
[facility] [address], [address] [place], [place], [address] Phone: [phone]   Fax:  [phone]  Physical Therapy Treatment  Patient Details  Name: Tyler Lozano MRN: [id number] Date of Birth: 01/31/1991 Referring Provider: Dr. Cammy CopaScott Gregory Dean  Encounter Date: 08/03/2016      PT End of Session - 08/03/16 0801    Visit Number 20   Date for PT Re-Evaluation 08/29/16   PT Start Time 0759   PT Stop Time 0838   PT Time Calculation (min) 39 min   Activity Tolerance Patient tolerated treatment well   Behavior During Therapy [facility] for tasks assessed/performed      Past Medical History:  Diagnosis Date  . Anterior cruciate ligament complete tear    left    Past Surgical History:  Procedure Laterality Date  . ANTERIOR CRUCIATE LIGAMENT REPAIR Left 05/16/2016   Procedure: RECONSTRUCTION ANTERIOR CRUCIATE LIGAMENT (ACL);  Surgeon: Cammy CopaScott Gregory Dean, MD;  [facility];  Service: Orthopedics;  Laterality: Left;  . WISDOM TOOTH EXTRACTION      There were no vitals filed for this visit.      Subjective Assessment - 08/03/16 0759    Subjective I ran Sunday night and Tuesday night. Knee feels good, it's just tough.   Patient is accompained by: Family member   Limitations Standing;Walking;Other (comment)   Patient Stated Goals return to regular activity, walking without crutches, and sports   Currently in Pain? No/denies   Pain Score 0-No pain                         [facility] Adult PT Treatment/Exercise - 08/03/16 0001      Knee/Hip Exercises: Aerobic   Elliptical L4 x 10 minutes  Therapist present to discuss treatment     Knee/Hip Exercises: Plyometrics   Bilateral Jumping 2 sets  30 seconds     Knee/Hip Exercises: Standing   Lateral Step Up Left;20 reps;Step Height: 8"   Functional Squat --  Star cone drills   Other Standing Knee Exercises stand on flat side of BOSU ball 2x10 mini squats  And  ball toss   Other Standing Knee Exercises Agility Ladder: quick direction changes     Knee/Hip Exercises: Seated   Long Arc Quad Left;3 sets;10 reps  5# 90-40 degrees                  PT Short Term Goals - 06/27/16 0837      PT SHORT TERM GOAL #3   Title improved AROM from 0-100 degrees for improved functional mobility such as going up and down stairs   Baseline 121 AROM   Status Achieved           PT Long Term Goals - 08/01/16 0826      PT LONG TERM GOAL #1   Title Pt will be independent with a HEP and safe gym exercises and use of equipment.   Time 8   Period Weeks   Status On-going     PT LONG TERM GOAL #5   Title Quad and hamstring strength improved to 5/5 MMT for improved stability when walking and returning to active lifestyle   Time 8   Period Weeks   Status On-going     PT LONG TERM GOAL #7   Title ability to start running program without difficulty   Time 6   Period Weeks   Status On-going  Plan - 08/03/16 1610    Clinical Impression Statement Pt did well with all agility and plyometric exercises today. Continues to have weakness in quads and decreased endurance. Patient reports knee feeling well with all exercises just weak. Patient will continue to benefit from skilled thearpy for knee stability and endurance.    Rehab Potential Excellent   Clinical Impairments Affecting Rehab Potential none   PT Frequency 2x / week   PT Duration 8 weeks   PT Treatment/Interventions ADLs/Self Care Home Management;Biofeedback;Cryotherapy;Lawyer;Therapeutic activities;Therapeutic exercise;Neuromuscular re-education;Balance training;Patient/family education;Manual techniques;Scar mobilization;Passive range of motion;Dry needling;Taping;Vasopneumatic Device   PT Next Visit Plan Plyometrics   Consulted and Agree with Plan of Care Patient      Patient will benefit from skilled therapeutic  intervention in order to improve the following deficits and impairments:  Abnormal gait, Decreased activity tolerance, Decreased endurance, Decreased mobility, Decreased range of motion, Decreased strength, Difficulty walking, Increased edema, Impaired flexibility, Pain  Visit Diagnosis: Stiffness of left knee, not elsewhere classified  Muscle weakness (generalized)  Difficulty in walking, not elsewhere classified  Acute pain of left knee     Problem List Patient Active Problem List   Diagnosis Date Noted  . Sprain of anterior cruciate ligament of left knee 01/25/2016  . Left knee pain 12/17/2015  . Left knee injury 12/17/2015    Dessa Phi PTA 08/03/2016, 9:05 AM  [facility] [address], [address] [place], [place], [address] Phone: [phone]   Fax:  [phone]  Name: Tyler Lozano MRN: [id number] Date of Birth: 10/08/1990

## 2016-08-08 ENCOUNTER — Ambulatory Visit: Payer: Managed Care, Other (non HMO)

## 2016-08-08 DIAGNOSIS — M25662 Stiffness of left knee, not elsewhere classified: Secondary | ICD-10-CM

## 2016-08-08 DIAGNOSIS — M25562 Pain in left knee: Secondary | ICD-10-CM

## 2016-08-08 DIAGNOSIS — M6281 Muscle weakness (generalized): Secondary | ICD-10-CM

## 2016-08-08 DIAGNOSIS — R262 Difficulty in walking, not elsewhere classified: Secondary | ICD-10-CM

## 2016-08-08 NOTE — Patient Instructions (Addendum)
Anterior Step-Down    Stand with both feet on _6-8 inch step. Step down in A direction with left foot, touching heel to the floor and return _10__ times. _2__ sets _2__ times per day.  http://gglj.exer.us/184   Copyright  VHI. All rights reserved.  [facility] [address], [address] [place], [place] [address] Phone # [phone] Fax [phone]

## 2016-08-08 NOTE — Therapy (Addendum)
[facility] [address], [address] [place], [place], [address] Phone: [phone]   Fax:  [phone]  Physical Therapy Treatment  Patient Details  Name: Tyler Lozano MRN: [id number] Date of Birth: [birth year] Referring Provider: Dr. Cammy Copa  Encounter Date: 08/08/2016      PT End of Session - 08/08/16 0837    Visit Number 21   Date for PT Re-Evaluation 08/29/16   PT Start Time 0801   PT Stop Time 0841   PT Time Calculation (min) 40 min   Activity Tolerance Patient tolerated treatment well   Behavior During Therapy [facility] for tasks assessed/performed      Past Medical History:  Diagnosis Date  . Anterior cruciate ligament complete tear    left    Past Surgical History:  Procedure Laterality Date  . ANTERIOR CRUCIATE LIGAMENT REPAIR Left 05/16/2016   Procedure: RECONSTRUCTION ANTERIOR CRUCIATE LIGAMENT (ACL);  Surgeon: Cammy Copa, MD;  Location: [facility] OR;  Service: Orthopedics;  Laterality: Left;  . WISDOM TOOTH EXTRACTION      There were no vitals filed for this visit.      Subjective Assessment - 08/08/16 0819    Subjective I have started running.  Some pain with impact.     Pain Score 0-No pain                         [facility] Adult PT Treatment/Exercise - 08/08/16 0001      Knee/Hip Exercises: Aerobic   Elliptical L4 x 10 minutes  Therapist present to discuss treatment     Knee/Hip Exercises: Machines for Strengthening   Cybex Leg Press Plyometric 50# 2x10; bil. 130# 3x10; Left 80# 3x10  Seat 6     Knee/Hip Exercises: Standing   Lateral Step Up Left;20 reps;Step Height: 8"   SLS SLS on mini tramp with red ball toss: 3x10   Walking with Sports Cord 45# forward and reverse, bil sidestepping x 10 each   Other Standing Knee Exercises stand on flat side of BOSU ball 2x10 mini squats  And ball toss   Other Standing Knee Exercises Agility Ladder: quick direction changes     Knee/Hip Exercises: Seated   Long Arc Quad Left;3 sets;10 reps  5# 90-40 degrees                  PT Short Term Goals - 06/27/16 0837      PT SHORT TERM GOAL #3   Title improved AROM from 0-100 degrees for improved functional mobility such as going up and down stairs   Baseline 121 AROM   Status Achieved           PT Long Term Goals - 08/08/16 0825      PT LONG TERM GOAL #1   Title Pt will be independent with a HEP and safe gym exercises and use of equipment.   Time 8   Period Weeks   Status On-going     PT LONG TERM GOAL #2   Title Pt will be able to descend the stairs with no pain using no hand rails with step over step pattern.   Status Achieved     PT LONG TERM GOAL #3   Title Pt will improve his FOTO from 70% limiation to </= 39% limitation.    Status Achieved     PT LONG TERM GOAL #6   Title ability to fully squat with no limitation due to  full ROM and strength   Time 6   Period Weeks   Status On-going     PT LONG TERM GOAL #7   Title ability to start running program without difficulty   Time 6   Period Weeks   Status On-going               Plan - 08/08/16 0831    Clinical Impression Statement Pt has initiated running and reports some instability and pain with this.  Pt with improved technique with agility exercises and mini squats on bosu.  Pt with continued eccentric weakness.  Pt will benefit from skilled PT for Lt knee strength, stability and proprioception.     Rehab Potential Excellent   PT Frequency 2x / week   PT Duration 8 weeks   PT Treatment/Interventions ADLs/Self Care Home Management;Biofeedback;Cryotherapy;Lawyerlectrical Stimulation;Moist Heat;Ultrasound;Gait training;Therapeutic activities;Therapeutic exercise;Neuromuscular re-education;Balance training;Patient/family education;Manual techniques;Scar mobilization;Passive range of motion;Dry needling;Taping;Vasopneumatic Device   PT Next Visit Plan agility, eccentric strength,  stability   Consulted and Agree with Plan of Care Patient      Patient will benefit from skilled therapeutic intervention in order to improve the following deficits and impairments:  Abnormal gait, Decreased activity tolerance, Decreased endurance, Decreased mobility, Decreased range of motion, Decreased strength, Difficulty walking, Increased edema, Impaired flexibility, Pain  Visit Diagnosis: Stiffness of left knee, not elsewhere classified  Muscle weakness (generalized)  Difficulty in walking, not elsewhere classified  Acute pain of left knee     Problem List Patient Active Problem List   Diagnosis Date Noted  . Sprain of anterior cruciate ligament of left knee 01/25/2016  . Left knee pain 12/17/2015  . Left knee injury 12/17/2015     Lorrene ReidKelly Takacs, PT 08/08/16 9:20 AM  [facility] [address], [address] [place], [place], [address] Phone: [phone]   Fax:  [phone]  Name: Tyler Lozano MRN: [id number] Date of Birth: 01/23/1991

## 2016-08-10 ENCOUNTER — Encounter: Payer: Self-pay | Admitting: Physical Therapy

## 2016-08-10 ENCOUNTER — Ambulatory Visit: Payer: Managed Care, Other (non HMO) | Admitting: Physical Therapy

## 2016-08-10 DIAGNOSIS — M6281 Muscle weakness (generalized): Secondary | ICD-10-CM

## 2016-08-10 DIAGNOSIS — R262 Difficulty in walking, not elsewhere classified: Secondary | ICD-10-CM

## 2016-08-10 DIAGNOSIS — M25562 Pain in left knee: Secondary | ICD-10-CM

## 2016-08-10 DIAGNOSIS — M25662 Stiffness of left knee, not elsewhere classified: Secondary | ICD-10-CM | POA: Diagnosis not present

## 2016-08-10 NOTE — Therapy (Signed)
[facility] [address] [place], [place], [address] Phone: [phone]   Fax:  [phone]  Physical Therapy Treatment  Patient Details  Name: Tyler Lozano MRN: [id number] Date of Birth: 1990/05/29 Referring Provider: Dr. Cammy Copa  Encounter Date: 08/10/2016      PT End of Session - 08/10/16 0800    Visit Number 22   Date for PT Re-Evaluation 08/29/16   PT Start Time 0758   PT Stop Time 0838   PT Time Calculation (min) 40 min   Activity Tolerance Patient tolerated treatment well   Behavior During Therapy [facility] for tasks assessed/performed      Past Medical History:  Diagnosis Date  . Anterior cruciate ligament complete tear    left    Past Surgical History:  Procedure Laterality Date  . ANTERIOR CRUCIATE LIGAMENT REPAIR Left 05/16/2016   Procedure: RECONSTRUCTION ANTERIOR CRUCIATE LIGAMENT (ACL);  Surgeon: Cammy Copa, MD;  Location: [facility] OR;  Service: Orthopedics;  Laterality: Left;  . WISDOM TOOTH EXTRACTION      There were no vitals filed for this visit.      Subjective Assessment - 08/10/16 0758    Subjective Nothing new to report   Patient is accompained by: Family member   Limitations Standing;Walking;Other (comment)   Patient Stated Goals return to regular activity, walking without crutches, and sports   Currently in Pain? No/denies   Pain Score 0-No pain                         [facility] Adult PT Treatment/Exercise - 08/10/16 0001      Knee/Hip Exercises: Aerobic   Elliptical L4 x 8 minutes  Therapist present to discuss treatment   Tread Mill 6 minutes 1.5 mph incline 15  Therapist present to minotor patient     Knee/Hip Exercises: Machines for Architect 50# 2x10; bil. 130# 3x10; Left 80# 3x10  Seat 6     Knee/Hip Exercises: Plyometrics   Bilateral Jumping 2 sets  squat jumps, 180, forward jumps     Knee/Hip Exercises:  Standing   Lateral Step Up Left;20 reps;Step Height: 6"   Functional Squat 5 reps;1 set  Star cone drills   Other Standing Knee Exercises Agility Ladder: quick direction changes                  PT Short Term Goals - 06/27/16 0837      PT SHORT TERM GOAL #3   Title improved AROM from 0-100 degrees for improved functional mobility such as going up and down stairs   Baseline 121 AROM   Status Achieved           PT Long Term Goals - 08/10/16 0801      PT LONG TERM GOAL #1   Title Pt will be independent with a HEP and safe gym exercises and use of equipment.   Time 8   Period Weeks   Status On-going     PT LONG TERM GOAL #5   Title Quad and hamstring strength improved to 5/5 MMT for improved stability when walking and returning to active lifestyle   Time 8   Period Weeks   Status On-going     PT LONG TERM GOAL #7   Title ability to start running program without difficulty   Status Achieved     PT LONG TERM GOAL #8   Title ability to  go down steps or incline with a controlled manner going down with left due to improved eccentric contraction   Time 6   Period Weeks   Status On-going               Plan - 08/10/16 [id number]    Clinical Impression Statement Patient is progressing well with strength and agility. Patient reports he still wanting to progress with decending stairs and inlcines. Patient also reports he has trouble with sull range quats for exercises. Patient will continue to benefit from skilled thearpy for knee stability and ROM.    Rehab Potential Excellent   Clinical Impairments Affecting Rehab Potential none   PT Frequency 2x / week   PT Duration 8 weeks   PT Treatment/Interventions ADLs/Self Care Home Management;Biofeedback;Cryotherapy;Lawyerlectrical Stimulation;Moist Heat;Ultrasound;Gait training;Therapeutic activities;Therapeutic exercise;Neuromuscular re-education;Balance training;Patient/family education;Manual techniques;Scar  mobilization;Passive range of motion;Dry needling;Taping;Vasopneumatic Device   PT Next Visit Plan agility, eccentric strength, stability   Consulted and Agree with Plan of Care Patient      Patient will benefit from skilled therapeutic intervention in order to improve the following deficits and impairments:  Abnormal gait, Decreased activity tolerance, Decreased endurance, Decreased mobility, Decreased range of motion, Decreased strength, Difficulty walking, Increased edema, Impaired flexibility, Pain  Visit Diagnosis: Stiffness of left knee, not elsewhere classified  Muscle weakness (generalized)  Difficulty in walking, not elsewhere classified  Acute pain of left knee     Problem List Patient Active Problem List   Diagnosis Date Noted  . Sprain of anterior cruciate ligament of left knee 01/25/2016  . Left knee pain 12/17/2015  . Left knee injury 12/17/2015    Tyrone AppleKatie Kylani Wires PTA 08/10/2016, 8:39 AM  [facility] [address] [place], [place], [address] Phone: [phone]   Fax:  [phone]  Name: Wyn QuakerJoseph Mohabir MRN: [id number] Date of Birth: 10/28/1990

## 2016-08-15 ENCOUNTER — Encounter: Payer: Self-pay | Admitting: Physical Therapy

## 2016-08-15 ENCOUNTER — Ambulatory Visit: Payer: Managed Care, Other (non HMO) | Admitting: Physical Therapy

## 2016-08-15 DIAGNOSIS — M25662 Stiffness of left knee, not elsewhere classified: Secondary | ICD-10-CM

## 2016-08-15 DIAGNOSIS — M25562 Pain in left knee: Secondary | ICD-10-CM

## 2016-08-15 DIAGNOSIS — M6281 Muscle weakness (generalized): Secondary | ICD-10-CM

## 2016-08-15 DIAGNOSIS — R262 Difficulty in walking, not elsewhere classified: Secondary | ICD-10-CM

## 2016-08-15 NOTE — Therapy (Addendum)
[facility] [address] [place], [place], [address] Phone: [phone]   Fax:  [phone]  Physical Therapy Treatment  Patient Details  Name: Tyler Lozano MRN: [id number] Date of Birth: 09/12/1990 Referring Provider: Dr. Scott Gregory Dean MD  Encounter Date: 08/15/2016      PT End of Session - 08/15/16 0801    Visit Number 23   Date for PT Re-Evaluation 08/29/16   PT Start Time 0800   PT Stop Time 0846   PT Time Calculation (min) 46 min   Activity Tolerance Patient tolerated treatment well   Behavior During Therapy WFL for tasks assessed/performed      Past Medical History:  Diagnosis Date  . Anterior cruciate ligament complete tear    left    Past Surgical History:  Procedure Laterality Date  . ANTERIOR CRUCIATE LIGAMENT REPAIR Left 05/16/2016   Procedure: RECONSTRUCTION ANTERIOR CRUCIATE LIGAMENT (ACL);  Surgeon: Scott Gregory Dean, MD;  Location: [facility] OR;  Service: Orthopedics;  Laterality: Left;  . WISDOM TOOTH EXTRACTION      There were no vitals filed for this visit.      Subjective Assessment - 08/15/16 0800    Subjective I've been trying to run more. My knee is hurting a little today after running yesterday, it hurt a little as I was running too, but not too bad.    Patient is accompained by: Family member   Limitations Standing;Walking;Other (comment)   Patient Stated Goals return to regular activity, walking without crutches, and sports   Currently in Pain? Yes   Pain Score 5    Pain Location Knee   Pain Orientation Left   Pain Descriptors / Indicators Aching   Pain Type Surgical pain   Pain Onset 1 to 4 weeks ago   Pain Frequency Intermittent            [facility] PT Assessment - 08/15/16 0001      Assessment   Medical Diagnosis S83.521D sprain of left ACL left knee   Referring Provider Dr. Scott Gregory Dean MD   Onset Date/Surgical Date 05/16/16     Precautions   Precautions  Knee     Restrictions   Weight Bearing Restrictions No     Balance Screen   Has the patient fallen in the past 6 months No   Has the patient had a decrease in activity level because of a fear of falling?  No   Is the patient reluctant to leave their home because of a fear of falling?  No     Home Environment   Living Environment Private residence   Available Help at Discharge Family     Prior Function   Level of Independence Independent   Vocation Full time employment     Cognition   Overall Cognitive Status Within Functional Limits for tasks assessed     Observation/Other Assessments   Focus on Therapeutic Outcomes (FOTO)  25% limited     AROM   AROM Assessment Site Knee   Right/Left Knee Left   Left Knee Extension 0   Left Knee Flexion 125     PROM   Right/Left Knee Left   Left Knee Extension 0   Left Knee Flexion 130  pain at end range     Strength   Left Hip Flexion 5/5   Left Hip ABduction 5/5   Left Knee Flexion 4+/5   Left Knee Extension 4+/5     Palpation   Patella   mobility moves well in all planes, swelling limiting mobility                     [facility] Adult PT Treatment/Exercise - 08/15/16 0001      Knee/Hip Exercises: Aerobic   Elliptical L4 x 8 minutes  Therapist present to discuss treatment   Tread Mill 6 minutes 1.5 mph incline 15  Therapist present to minotor patient     Knee/Hip Exercises: Machines for Strengthening   Cybex Leg Press Plyometric 60# 2x10; bil. 130# 3x10; Left 80# 3x10  Seat 6     Knee/Hip Exercises: Plyometrics   Bilateral Jumping 2 sets  squat jumps, 180, forward jumps     Knee/Hip Exercises: Standing   Lateral Step Up Left;20 reps;Step Height: 6"   Other Standing Knee Exercises Agility Ladder: quick direction changes     Knee/Hip Exercises: Supine   Bridges with Clamshell Both;Strengthening  x50; red Tband   Single Leg Bridge Both;10 reps                  PT Short Term Goals - 08/15/16 0851       PT SHORT TERM GOAL #1   Title pt will be indpendent in his HEP   Status Achieved     PT SHORT TERM GOAL #2   Title Able to ambulate with good heel strike without crutches   Status Achieved     PT SHORT TERM GOAL #3   Title improved AROM from 0-100 degrees for improved functional mobility such as going up and down stairs   Status Achieved           PT Long Term Goals - 08/15/16 0802      PT LONG TERM GOAL #1   Title Pt will be independent with a HEP and safe gym exercises and use of equipment.   Time --   Period --   Status Achieved     PT LONG TERM GOAL #2   Title Pt will be able to descend the stairs with no pain using no hand rails with step over step pattern.   Status Achieved     PT LONG TERM GOAL #3   Title Pt will improve his FOTO from 70% limiation to </= 39% limitation.    Baseline 25%   Status Achieved     PT LONG TERM GOAL #4   Title Patient will have left knee flexion within 10 degrees of right knee for return to sports such as soccer   Status Achieved     PT LONG TERM GOAL #5   Title Quad and hamstring strength improved to 5/5 MMT for improved stability when walking and returning to active lifestyle   Time --   Period --   Status Achieved     PT LONG TERM GOAL #6   Title ability to fully squat with no limitation due to full ROM and strength   Status Achieved     PT LONG TERM GOAL #7   Title ability to start running program without difficulty   Status Achieved     PT LONG TERM GOAL #8   Title ability to go down steps or incline with a controlled manner going down with left due to improved eccentric contraction   Status Achieved               Plan - 08/15/16 0852    Clinical Impression Statement Patient able to tolerate all plyometircs and all squatting strenghtening   exercises well. Patient has been running and exercises independently at home with no difficulty, some pain but most likely from weakness. Patient reports feeling that he  is ready to continiue with HEP. Will see MD on Wednesday May 30 and will see what he says before returning to PT.    PT Next Visit Plan Possible discharge after 08/23/16. Patient will call.    Consulted and Agree with Plan of Care Patient      Patient will benefit from skilled therapeutic intervention in order to improve the following deficits and impairments:  Abnormal gait, Decreased activity tolerance, Decreased endurance, Decreased mobility, Decreased range of motion, Decreased strength, Difficulty walking, Increased edema, Impaired flexibility, Pain  Visit Diagnosis: Stiffness of left knee, not elsewhere classified  Muscle weakness (generalized)  Difficulty in walking, not elsewhere classified  Acute pain of left knee     Problem List Patient Active Problem List   Diagnosis Date Noted  . Sprain of anterior cruciate ligament of left knee 01/25/2016  . Left knee pain 12/17/2015  . Left knee injury 12/17/2015    Jeanie Sewer PTA 08/15/2016, 9:08 AM PHYSICAL THERAPY DISCHARGE SUMMARY  Visits from Start of Care: 23  Current functional level related to goals / functional outcomes: See above for current status.  Pt went to MD and was released from PT.     Remaining deficits: Mild pain and endurance deficits with running.     Education / Equipment: HEP Plan: Patient agrees to discharge.  Patient goals were met. Patient is being discharged due to meeting the stated rehab goals.  ?????     Sigurd Sos, PT 08/31/16 10:43 AM   [facility] [address], [place], [place], [address] Phone: [phone]   Fax:  [phone]  Name: Rube Sanchez MRN: [id number] Date of Birth: February 08, 1991

## 2016-08-17 ENCOUNTER — Encounter: Payer: Managed Care, Other (non HMO) | Admitting: Physical Therapy

## 2016-08-22 ENCOUNTER — Encounter: Payer: Managed Care, Other (non HMO) | Admitting: Physical Therapy

## 2016-08-23 ENCOUNTER — Ambulatory Visit (INDEPENDENT_AMBULATORY_CARE_PROVIDER_SITE_OTHER): Payer: Managed Care, Other (non HMO) | Admitting: Orthopedic Surgery

## 2016-08-24 ENCOUNTER — Encounter: Payer: Managed Care, Other (non HMO) | Admitting: Physical Therapy

## 2016-08-29 ENCOUNTER — Encounter: Payer: Managed Care, Other (non HMO) | Admitting: Physical Therapy

## 2016-08-31 ENCOUNTER — Encounter: Payer: Managed Care, Other (non HMO) | Admitting: Physical Therapy

## 2016-09-04 ENCOUNTER — Encounter (INDEPENDENT_AMBULATORY_CARE_PROVIDER_SITE_OTHER): Payer: Self-pay | Admitting: Orthopedic Surgery

## 2016-09-04 ENCOUNTER — Ambulatory Visit (INDEPENDENT_AMBULATORY_CARE_PROVIDER_SITE_OTHER): Payer: Managed Care, Other (non HMO) | Admitting: Orthopedic Surgery

## 2016-09-04 DIAGNOSIS — S83512D Sprain of anterior cruciate ligament of left knee, subsequent encounter: Secondary | ICD-10-CM | POA: Diagnosis not present

## 2016-09-06 NOTE — Progress Notes (Signed)
Office Visit Note   Patient: Tyler Lozano           Date of Birth: 12/20/1990           MRN: [id number] Visit Date: 09/04/2016 Requested by: Jac Canavanysinger, David S, PA-C [address] [place], [place] [id number] PCP: Jac Canavanysinger, David S, PA-C  Subjective: Chief Complaint  Patient presents with  . Left Knee - Follow-up    HPI: All systems reviewed are negative as they relate to the chief complaint within the history of present illness.  Patient denies  fevers or chills.               ROS: Tyler Lozano is a patient who underwent left knee anterior cruciate ligament reconstruction 05/16/2016.  He's doing well.  No problems.  He is out of physical therapy.  He's doing swimming and straightahead running.  He wants to play golf which I think is okay at this time. Assessment & Plan: Visit Diagnoses:  1. Sprain of anterior cruciate ligament of left knee, subsequent encounter     Plan: Impression is patient is doing well following left knee anterior cruciate ligament reconstruction.  His graft is stable.  There is no effusion.  Quad and hamstring strength are improving.  I don't want him doing any cutting or pivoting sports until the late fall.  Continue with strengthening and straightahead running exercises.  I think also be fine for him to play.  I'll see him back as needed  Follow-Up Instructions: Return if symptoms worsen or fail to improve.   Orders:  No orders of the defined types were placed in this encounter.  No orders of the defined types were placed in this encounter.     Procedures: No procedures performed   Clinical Data: No additional findings.  Objective: Vital Signs: There were no vitals taken for this visit.  Physical Exam:   Constitutional: Patient appears well-developed HEENT:  Head: Normocephalic Eyes:EOM are normal Neck: Normal range of motion Cardiovascular: Normal rate Pulmonary/chest: Effort normal Neurologic: Patient is alert Skin: Skin is  warm Psychiatric: Patient has normal mood and affect    Ortho Exam: Orthopedic exam demonstrates excellent range of motion no effusion stable graft on the left-hand side no joint line tenderness well-healed incisions minimal quad and hamstring atrophy on the order of about 1 cm.  Only about a 15% hamstring strength deficit left versus right  Specialty Comments:  No specialty comments available.  Imaging: No results found.   PMFS History: Patient Active Problem List   Diagnosis Date Noted  . Sprain of anterior cruciate ligament of left knee 01/25/2016  . Left knee pain 12/17/2015  . Left knee injury 12/17/2015   Past Medical History:  Diagnosis Date  . Anterior cruciate ligament complete tear    left    No family history on file.  Past Surgical History:  Procedure Laterality Date  . ANTERIOR CRUCIATE LIGAMENT REPAIR Left 05/16/2016   Procedure: RECONSTRUCTION ANTERIOR CRUCIATE LIGAMENT (ACL);  Surgeon: Cammy CopaScott Gregory Dean, MD;  [facility];  Service: Orthopedics;  Laterality: Left;  . WISDOM TOOTH EXTRACTION     Social History   Occupational History  . Not on file.   Social History Main Topics  . Smoking status: Never Smoker  . Smokeless tobacco: Never Used  . Alcohol use 3.0 oz/week    5 Shots of liquor per week     Comment: weekend drinker -  x5 drinks   . Drug use: No  . Sexual activity:  Not on file

## 2016-10-06 NOTE — Addendum Note (Signed)
Addendum  created 10/06/16 0923 by Nataliya Graig, MD   Sign clinical note    

## 2016-10-06 NOTE — Anesthesia Postprocedure Evaluation (Signed)
Anesthesia Post Note  Patient: Tyler Lozano  Procedure(s) Performed: Procedure(s) (LRB): RECONSTRUCTION ANTERIOR CRUCIATE LIGAMENT (ACL) (Left)     Anesthesia Post Evaluation  Last Vitals:  Vitals:   05/16/16 1445 05/16/16 1500  BP: (!) 104/59 109/63  Pulse: 85 87  Resp: (!) 9 11  Temp:  36.3 C    Last Pain:  Vitals:   05/16/16 1430  TempSrc:   PainSc: Asleep                 Lewie LoronJohn Monserrat Vidaurri

## 2017-10-26 IMAGING — MR MR KNEE*L* W/O CM
4 of 6 series · 21 of 40 positions shown · non-contrast
Comparison: None.

CLINICAL DATA: Left knee pain, difficulty with range of motion,
injured playing basketball 4 months ago.

EXAM:
MRI OF THE LEFT KNEE WITHOUT CONTRAST
TECHNIQUE: Multiplanar, multisequence MR imaging of the knee was performed. No
intravenous contrast was administered.

[Series 3: pd_tse_fs_tra · axial · 4.0mm · 0.42mm/px · z∈[-36,+47]mm · 3 of 24 slices shown]
[im 5/24]
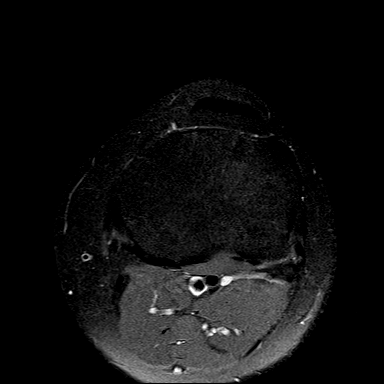
[im 14/24]
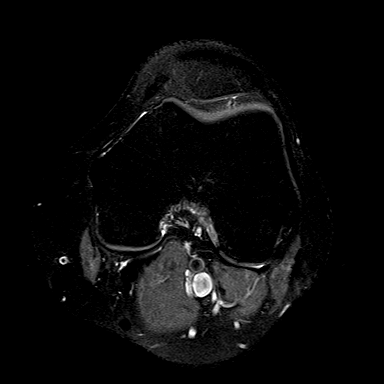
[im 24/24]
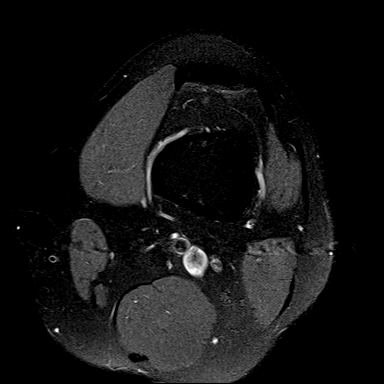

[Series 5: T2 fat-sat · coronal · 3.2mm · 0.62mm/px · 8 of 26 slices shown]
[im 1/26]
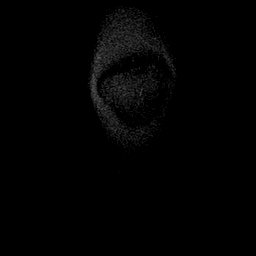
[im 4/26]
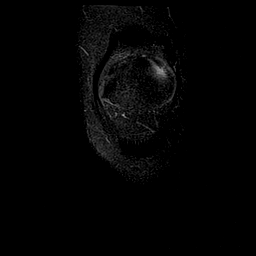
[im 8/26]
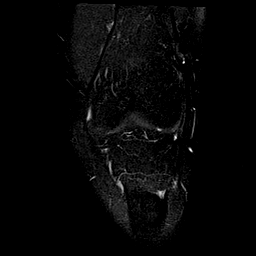
[im 11/26]
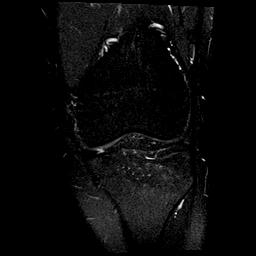
[im 15/26]
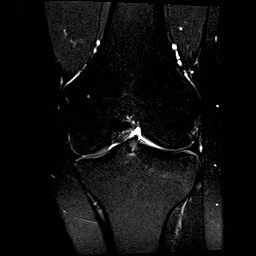
[im 18/26]
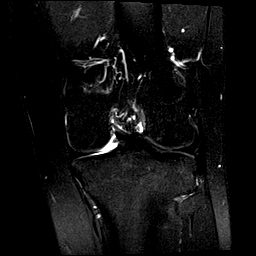
[im 22/26]
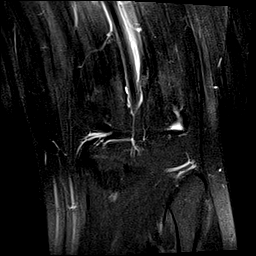
[im 26/26]
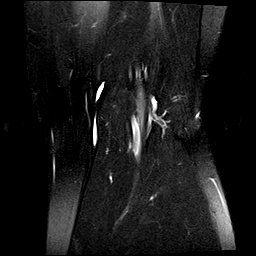

[Series 6: PD fat-sat · sagittal · 3.5mm · 0.25mm/px · 7 of 23 slices shown]
[im 1/23]
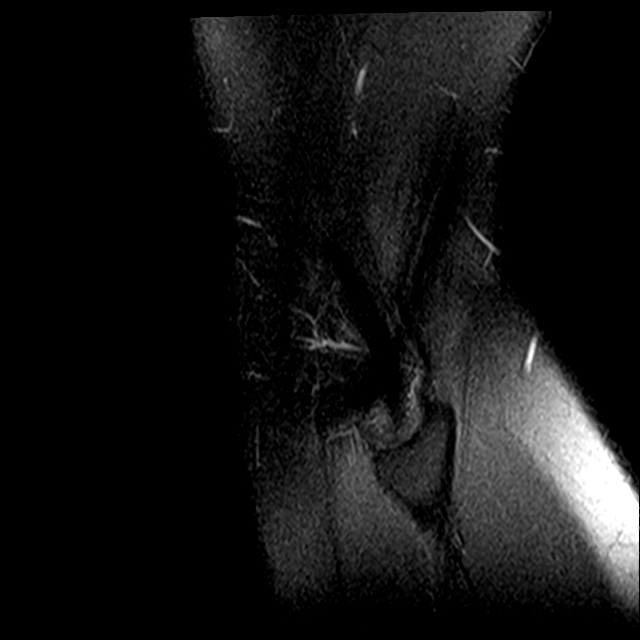
[im 4/23]
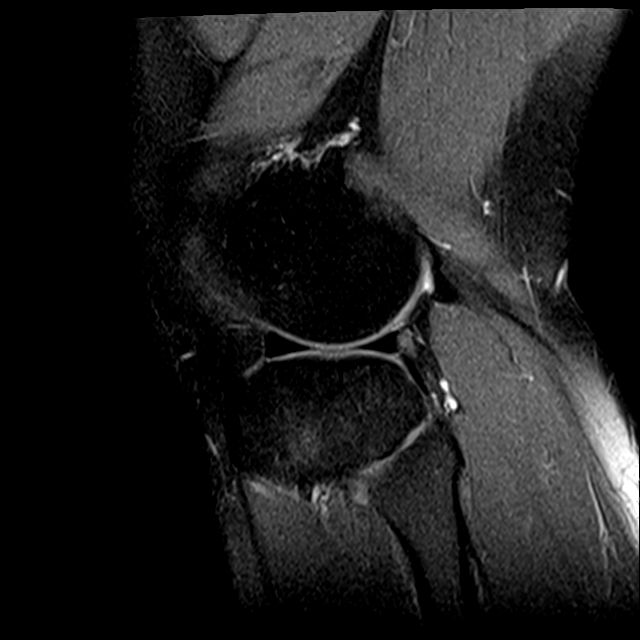
[im 8/23]
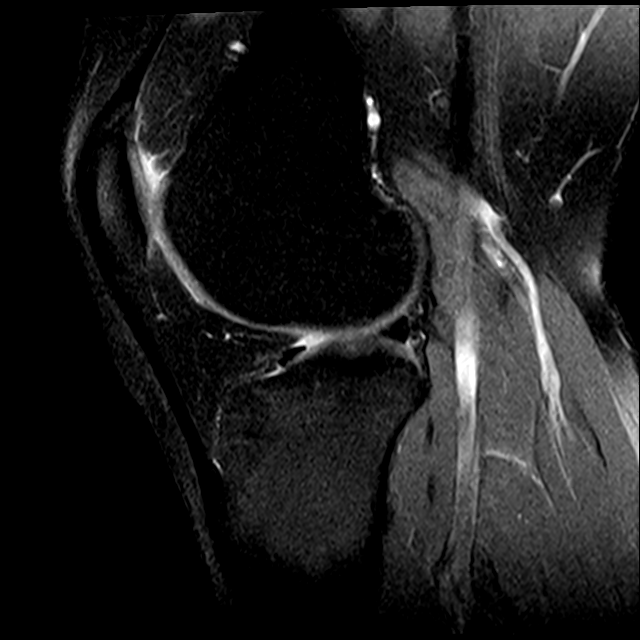
[im 12/23]
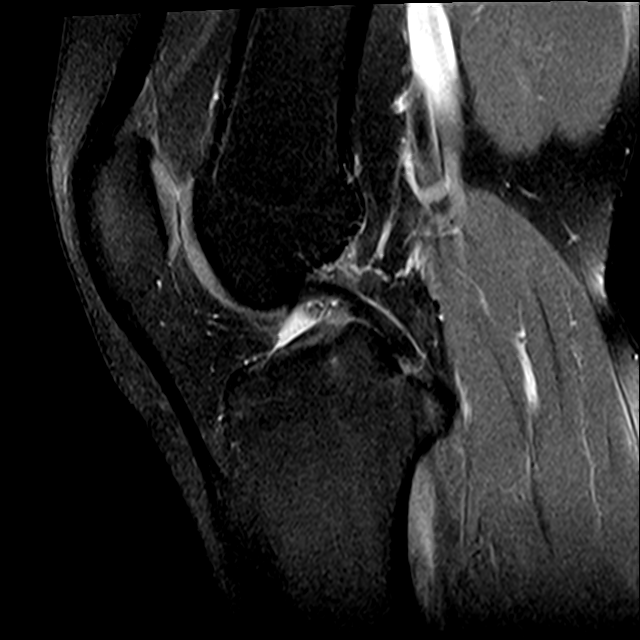
[im 15/23]
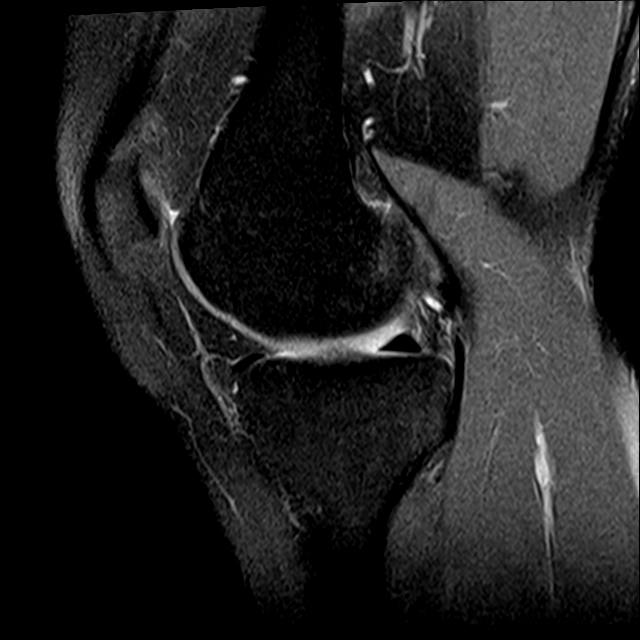
[im 19/23]
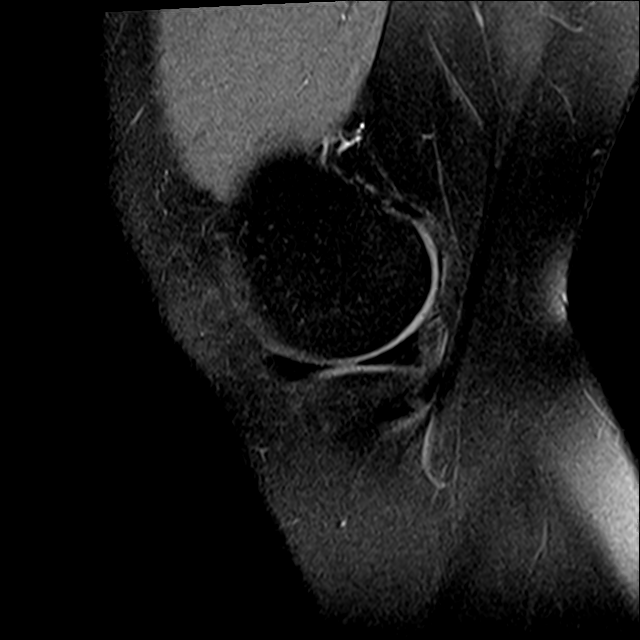
[im 23/23]
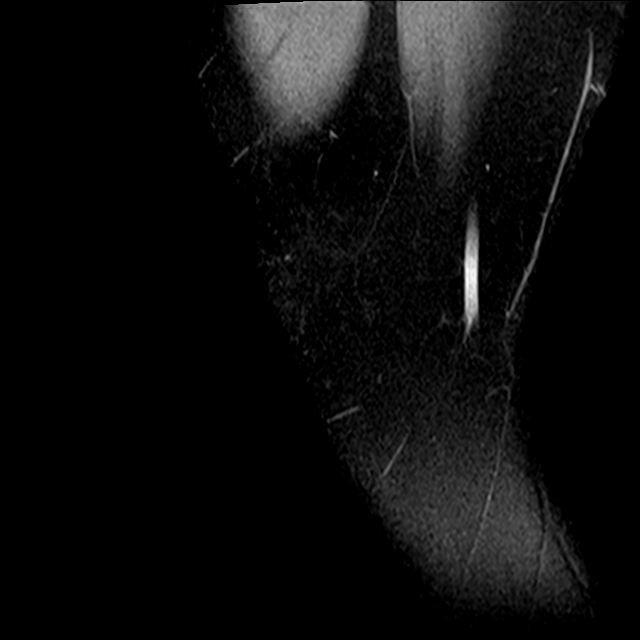

[Series 7: T1 · coronal · 3.2mm · 0.25mm/px · 3 of 26 slices shown]
[im 4/26]
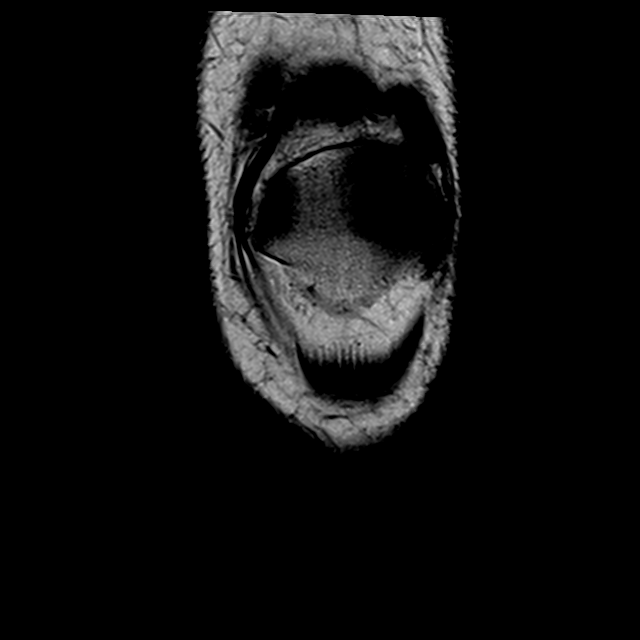
[im 15/26]
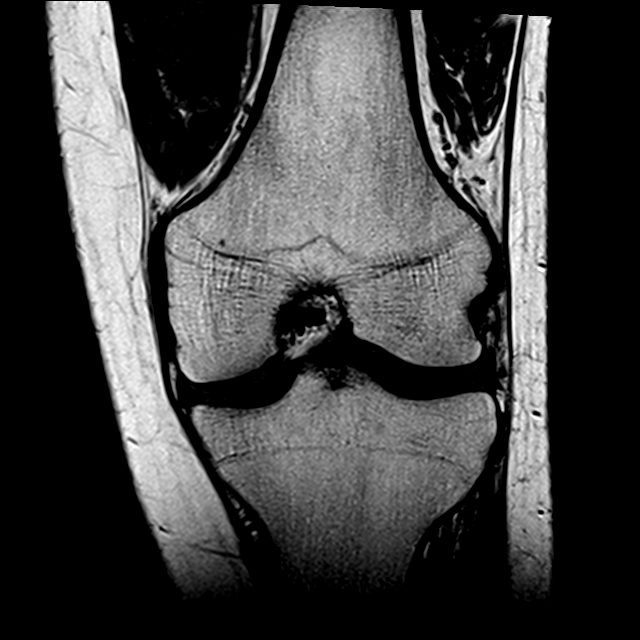
[im 22/26]
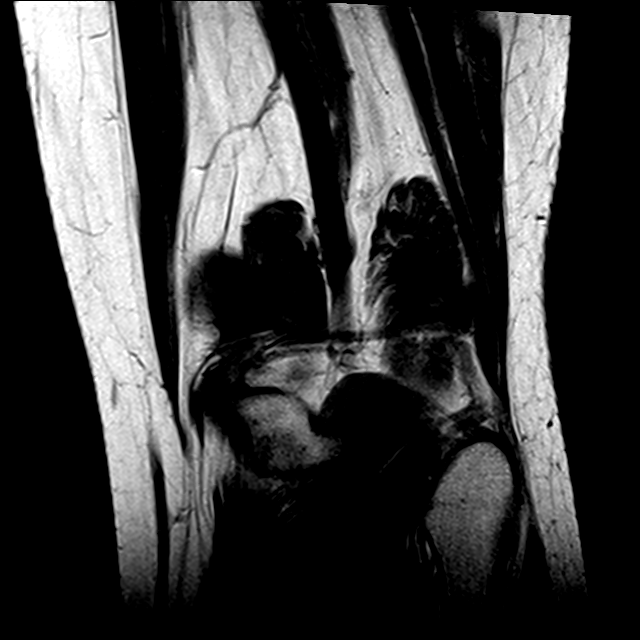

[21 of 40 positions shown; findings below may reference images not displayed]

FINDINGS: MENISCI

Medial meniscus:  Intact.

Lateral meniscus:  Intact.

LIGAMENTS

Cruciates: Intact PCL. Severely attenuated proximal ACL at the
femoral attachment concerning for a subacute-chronic high-grade
partial versus complete tear. Correlate with clinical exam.

Collaterals: Medial collateral ligament is intact. Lateral
collateral ligament complex is intact.

CARTILAGE

Patellofemoral:  No chondral defect.

Medial:  No chondral defect.

Lateral:  No chondral defect.

Joint: No joint effusion. Normal Hoffa's fat. No plical thickening.

Popliteal Fossa:  Tiny Baker cyst.  Intact popliteus tendon.

Extensor Mechanism: Intact patellar tendon and quadriceps tendon.
Intact medial and lateral patellar retinaculum. Intact MPFL.

Bones:  No marrow signal abnormality.  No fracture or dislocation.

Other: No fluid collection or hematoma.
IMPRESSION: 1. Severely attenuated proximal ACL at the femoral attachment
concerning for a subacute-chronic high-grade partial versus complete
tear. No osseous contusions.
2. No meniscal injury of the left knee.

## 2017-12-17 DIAGNOSIS — Z23 Encounter for immunization: Secondary | ICD-10-CM | POA: Diagnosis not present

## 2018-02-18 DIAGNOSIS — Z0000 Encounter for general adult medical examination without abnormal findings: Secondary | ICD-10-CM | POA: Diagnosis not present

## 2018-02-18 DIAGNOSIS — Z23 Encounter for immunization: Secondary | ICD-10-CM | POA: Diagnosis not present

## 2018-02-18 DIAGNOSIS — Z13220 Encounter for screening for lipoid disorders: Secondary | ICD-10-CM | POA: Diagnosis not present

## 2018-11-04 DIAGNOSIS — Z20828 Contact with and (suspected) exposure to other viral communicable diseases: Secondary | ICD-10-CM | POA: Diagnosis not present

## 2019-03-17 DIAGNOSIS — Z0000 Encounter for general adult medical examination without abnormal findings: Secondary | ICD-10-CM | POA: Diagnosis not present

## 2020-02-29 DIAGNOSIS — Z23 Encounter for immunization: Secondary | ICD-10-CM | POA: Diagnosis not present

## 2021-07-11 DIAGNOSIS — Z79899 Other long term (current) drug therapy: Secondary | ICD-10-CM | POA: Diagnosis not present

## 2021-07-11 DIAGNOSIS — H3562 Retinal hemorrhage, left eye: Secondary | ICD-10-CM | POA: Diagnosis not present

## 2021-11-30 ENCOUNTER — Encounter: Payer: Self-pay | Admitting: Internal Medicine

## 2022-04-24 DIAGNOSIS — U071 COVID-19: Secondary | ICD-10-CM | POA: Diagnosis not present

## 2022-07-17 DIAGNOSIS — Z13220 Encounter for screening for lipoid disorders: Secondary | ICD-10-CM | POA: Diagnosis not present

## 2022-07-17 DIAGNOSIS — Z0000 Encounter for general adult medical examination without abnormal findings: Secondary | ICD-10-CM | POA: Diagnosis not present

## 2022-07-17 DIAGNOSIS — Z131 Encounter for screening for diabetes mellitus: Secondary | ICD-10-CM | POA: Diagnosis not present

## 2022-10-06 DIAGNOSIS — L089 Local infection of the skin and subcutaneous tissue, unspecified: Secondary | ICD-10-CM | POA: Diagnosis not present

## 2022-10-06 DIAGNOSIS — L600 Ingrowing nail: Secondary | ICD-10-CM | POA: Diagnosis not present

## 2022-10-19 ENCOUNTER — Ambulatory Visit: Payer: BC Managed Care – PPO | Admitting: Podiatry

## 2022-10-19 DIAGNOSIS — L600 Ingrowing nail: Secondary | ICD-10-CM | POA: Diagnosis not present

## 2022-10-19 NOTE — Progress Notes (Signed)
  Subjective:  Patient ID: Tyler Lozano, male    DOB: 08-31-1990,  MRN: [id number]  Chief Complaint  Patient presents with   Ingrown Toenail    Ingrown left hallux- lateral border. Was on antibiotics that were prescribed by PCP. Last dose was taken last Friday.     32 y.o. male presents with concern for pain in the left hallux lateral border.  He has redness swelling and concern for ingrown.  He was placed on antibiotics by his primary care and said that helped slightly with the redness and swelling but still having some pain with pressure on the area.  Has not been able to wear closed toed shoes without pain  Past Medical History:  Diagnosis Date   Anterior cruciate ligament complete tear    left    Allergies  Allergen Reactions   No Known Allergies     ROS: Negative except as per HPI above  Objective:  General: AAO x3, NAD  Dermatological: Incurvation is present along the lateral nail border of the left great toe. There is localized edema without any erythema or increase in warmth around the nail border. There is no drainage or pus. There is no ascending cellulitis. No malodor. No open lesions or pre-ulcerative lesions.    Vascular:  Dorsalis Pedis artery and Posterior Tibial artery pedal pulses are 2/4 bilateral.  Capillary fill time < 3 sec to all digits.   Neruologic: Grossly intact via light touch bilateral. Protective threshold intact to all sites bilateral.   Musculoskeletal: No gross boney pedal deformities bilateral. No pain, crepitus, or limitation noted with foot and ankle range of motion bilateral. Muscular strength 5/5 in all groups tested bilateral.  Gait: Unassisted, Nonantalgic.   No images are attached to the encounter.  Assessment:   1. Ingrown nail of great toe of left foot      Plan:  Patient was evaluated and treated and all questions answered.  Ingrown Nail, left -Patient elects to proceed with minor surgery to remove ingrown toenail today.  Consent reviewed and signed by patient. -Ingrown nail excised. See procedure note. -Educated on post-procedure care including soaking. Written instructions provided and reviewed. -Patient to follow up in 2 weeks for nail check.  Procedure: Excision of Ingrown Toenail Location: Left 1st toe lateral nail borders. Anesthesia: Lidocaine 1% plain; 1.5 mL and Marcaine 0.5% plain; 1.5 mL, digital block. Skin Prep: Betadine. Dressing: Silvadene; telfa; dry, sterile, compression dressing. Technique: Following skin prep, the toe was exsanguinated and a tourniquet was secured at the base of the toe. The affected nail border was freed, split with a nail splitter, and excised. Chemical matrixectomy was then performed with phenol and irrigated out with alcohol. The tourniquet was then removed and sterile dressing applied. Disposition: Patient tolerated procedure well. Patient to return in 2 weeks for follow-up.    Return in about 2 weeks (around 11/02/2022) for nail check.          Corinna Gab, DPM [facility] / [facility]

## 2022-10-19 NOTE — Patient Instructions (Signed)

## 2022-10-20 DIAGNOSIS — L501 Idiopathic urticaria: Secondary | ICD-10-CM | POA: Diagnosis not present

## 2022-11-02 ENCOUNTER — Ambulatory Visit: Payer: BC Managed Care – PPO | Admitting: Podiatry

## 2022-11-02 DIAGNOSIS — L600 Ingrowing nail: Secondary | ICD-10-CM

## 2022-11-02 NOTE — Progress Notes (Signed)
Subjective: Tyler Lozano is a 32 y.o.  male returns to office today for follow up evaluation after having left Hallux lateral border nail ingrown removal with phenol and alcohol matrixectomy approximately 2 weeks ago. Patient has been soaking using epsom salts and applying topical antibiotic covered with bandaid daily. Patient denies fevers, chills, nausea, vomiting. Denies any calf pain, chest pain, SOB.   Objective:  Vitals: Reviewed  General: Well developed, nourished, in no acute distress, alert and oriented x3   Dermatology: Skin is warm, dry and supple bilateral. left hallux nail border appears to be clean, dry, with mild granular tissue and surrounding scab. There is no surrounding erythema, edema, drainage/purulence. The remaining nails appear unremarkable at this time. There are no other lesions or other signs of infection present.  Neurovascular status: Intact. No lower extremity swelling; No pain with calf compression bilateral.  Musculoskeletal: Decreased tenderness to palpation of the left hallux nail fold(s). Muscular strength within normal limits bilateral.   Assesement and Plan: S/p phenol and alcohol matrixectomy to the  left hallux nail lateral, doing well.   -Continue soaking in epsom salts twice a day followed by antibiotic ointment and a band-aid. Can leave uncovered at night. Continue this until completely healed.  -If the area has not healed in 2 weeks, call the office for follow-up appointment, or sooner if any problems arise.  -Monitor for any signs/symptoms of infection. Call the office immediately if any occur or go directly to the emergency room. Call with any questions/concerns.        Corinna Gab, DPM [facility] / [facility]                   11/02/2022

## 2022-12-12 DIAGNOSIS — M79652 Pain in left thigh: Secondary | ICD-10-CM | POA: Diagnosis not present

## 2023-08-08 ENCOUNTER — Ambulatory Visit: Admitting: Podiatry

## 2023-08-08 ENCOUNTER — Encounter: Payer: Self-pay | Admitting: Podiatry

## 2023-08-08 ENCOUNTER — Ambulatory Visit: Admitting: Podiatrist

## 2023-08-08 VITALS — Ht 68.5 in | Wt 192.8 lb

## 2023-08-08 DIAGNOSIS — L600 Ingrowing nail: Secondary | ICD-10-CM

## 2023-08-09 NOTE — Progress Notes (Signed)
 Subjective:   Patient ID: Tyler Lozano, male   DOB: 33 y.o.   MRN: [id number]   HPI Patient states he had surgery on his left big toenail last year and now the right big toe and sore and he would want to get it fixed.  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

## 2023-08-21 DIAGNOSIS — Z0000 Encounter for general adult medical examination without abnormal findings: Secondary | ICD-10-CM | POA: Diagnosis not present
